# Patient Record
Sex: Male | Born: 1950
Health system: Southern US, Community
[De-identification: ages and names within clinical notes are randomized; demographics above are authoritative.]

## PROBLEM LIST (undated history)

## (undated) DIAGNOSIS — M1990 Unspecified osteoarthritis, unspecified site: Secondary | ICD-10-CM

## (undated) DIAGNOSIS — K219 Gastro-esophageal reflux disease without esophagitis: Secondary | ICD-10-CM

## (undated) DIAGNOSIS — N400 Enlarged prostate without lower urinary tract symptoms: Secondary | ICD-10-CM

## (undated) DIAGNOSIS — C61 Malignant neoplasm of prostate: Secondary | ICD-10-CM

## (undated) DIAGNOSIS — Z973 Presence of spectacles and contact lenses: Secondary | ICD-10-CM

## (undated) DIAGNOSIS — Z87442 Personal history of urinary calculi: Secondary | ICD-10-CM

## (undated) HISTORY — PX: PROSTATE BIOPSY: SHX241

## (undated) HISTORY — PX: WISDOM TOOTH EXTRACTION: SHX21

---

## 2014-01-30 ENCOUNTER — Encounter (HOSPITAL_BASED_OUTPATIENT_CLINIC_OR_DEPARTMENT_OTHER): Payer: Self-pay | Admitting: Emergency Medicine

## 2014-01-30 ENCOUNTER — Encounter (HOSPITAL_COMMUNITY): Payer: Self-pay | Admitting: *Deleted

## 2014-01-30 ENCOUNTER — Emergency Department (HOSPITAL_BASED_OUTPATIENT_CLINIC_OR_DEPARTMENT_OTHER)
Admission: EM | Admit: 2014-01-30 | Discharge: 2014-01-30 | Disposition: A | Discharge status: Home / Self Care | Payer: BC Managed Care – PPO | Attending: Emergency Medicine | Admitting: Emergency Medicine

## 2014-01-30 ENCOUNTER — Emergency Department (HOSPITAL_BASED_OUTPATIENT_CLINIC_OR_DEPARTMENT_OTHER): Payer: BC Managed Care – PPO

## 2014-01-30 DIAGNOSIS — S61209A Unspecified open wound of unspecified finger without damage to nail, initial encounter: Secondary | ICD-10-CM | POA: Insufficient documentation

## 2014-01-30 DIAGNOSIS — Z23 Encounter for immunization: Secondary | ICD-10-CM | POA: Diagnosis not present

## 2014-01-30 DIAGNOSIS — Z87442 Personal history of urinary calculi: Secondary | ICD-10-CM | POA: Insufficient documentation

## 2014-01-30 DIAGNOSIS — Y929 Unspecified place or not applicable: Secondary | ICD-10-CM | POA: Diagnosis not present

## 2014-01-30 DIAGNOSIS — Y9389 Activity, other specified: Secondary | ICD-10-CM | POA: Insufficient documentation

## 2014-01-30 DIAGNOSIS — M129 Arthropathy, unspecified: Secondary | ICD-10-CM | POA: Diagnosis not present

## 2014-01-30 DIAGNOSIS — W278XXA Contact with other nonpowered hand tool, initial encounter: Secondary | ICD-10-CM | POA: Insufficient documentation

## 2014-01-30 DIAGNOSIS — S66521A Laceration of intrinsic muscle, fascia and tendon of left index finger at wrist and hand level, initial encounter: Secondary | ICD-10-CM

## 2014-01-30 DIAGNOSIS — S61211A Laceration without foreign body of left index finger without damage to nail, initial encounter: Secondary | ICD-10-CM

## 2014-01-30 MED ORDER — CEFAZOLIN SODIUM 1-5 GM-% IV SOLN: 1.0000 g | Freq: Once | INTRAVENOUS | Status: DC

## 2014-01-30 MED ORDER — OXYCODONE-ACETAMINOPHEN 5-325 MG PO TABS: 1.0000 | ORAL_TABLET | Freq: Four times a day (QID) | ORAL | Status: DC | PRN

## 2014-01-30 MED ORDER — TETANUS-DIPHTH-ACELL PERTUSSIS 5-2.5-18.5 LF-MCG/0.5 IM SUSP
0.5000 mL | Freq: Once | INTRAMUSCULAR | Status: AC
  Administered 2014-01-30: 0.5 mL via INTRAMUSCULAR
  Filled 2014-01-30: qty 0.5

## 2014-01-30 NOTE — ED Provider Notes (Signed)
Pt seen with Oswaldo ConroyVictoria Creech, PA-C. Pt with injury of his left first MCP from a saw. Refill less than 3 seconds. Unable to fully extend index finger at MCP, PIP and DIP. X-ray pending. Tetanus will be updated. Plan to consult hand surgery.  I spoke with Dr. Melvyn Novasrtmann, hand surgery on call who states patient will need surgery tomorrow morning. [facility] at 7:30 AM for surgery at 9AM. NPO after midnight. No abx. Wrap with betadine applied along with finger splinted in extension per Dr. Melvyn Novasrtmann. Pt agreeable with plan. Stable for d/c. Return precautions given.  Case discussed with attending Dr. Wilkie AyeHorton who also evaluated patient and agrees with plan of care.   Trevor MaceRobyn M Albert, PA-C 01/30/14 2129

## 2014-01-30 NOTE — ED Notes (Signed)
C/o laceration to left hand and index finger from saw. States he has nl feeling in finger but cannot raise index finger of left hand. Bleeding controlled.

## 2014-01-30 NOTE — ED Provider Notes (Signed)
CSN: [id number]     Arrival date & time 01/30/14  1416 History   First MD Initiated Contact with Patient 01/30/14 1421     Chief Complaint  Patient presents with  . Extremity Laceration    HPI Patient is a 63 year old male who presents after a left hand injury with a saw. Injury to the left second finger MCP on the dorsal hand. Also small laceration to distal left second finger distal to PIP. Pain is mild but persistent. It is not worsening or improving and does not radiate. Patient can only minimally extend his left second finger but moves all other fingers without difficulty or pain. Patient denies injury anywhere else. He has not used any medications or pain relief. Patient cannot remember if last tetanus was in the last 5 or 10 years. Patient denies fevers.  Past Medical History  Diagnosis Date  . Kidney stone   . Arthritis    Past Surgical History  Procedure Laterality Date  . Wisdom tooth extraction     Family History  Problem Relation Age of Onset  . Diabetes Father   . Diabetes Sister    History  Substance Use Topics  . Smoking status: Never Smoker   . Smokeless tobacco: Never Used  . Alcohol Use: Not on file     Comment: rare    Review of Systems  Musculoskeletal: Positive for joint swelling.  Skin: Positive for wound.  Neurological: Positive for weakness and numbness.      Allergies  Review of patient's allergies indicates no known allergies.  Home Medications   Prior to Admission medications   Medication Sig Start Date End Date Taking? Authorizing Provider  oxyCODONE-acetaminophen (PERCOCET) 5-325 MG per tablet Take 1-2 tablets by mouth every 6 (six) hours as needed for severe pain. 01/30/14   Jorge Mace, PA-C   BP 140/85  Pulse 64  Temp(Src) 98.5 F (36.9 C) (Oral)  Resp 20  Wt 178 lb (80.74 kg)  SpO2 98% Physical Exam  Nursing note and vitals reviewed. Constitutional: He appears well-developed and well-nourished. No distress.  HENT:  Head:  Normocephalic and atraumatic.  Eyes: Conjunctivae are normal. Right eye exhibits no discharge. Left eye exhibits no discharge. No scleral icterus.  Pulmonary/Chest: Effort normal. No respiratory distress.  Musculoskeletal:  3-4 cm laceration to left dorsal MCP with the tendon exposed. Bleeding controlled without gross contamination. No foreign bodies. Strength 5/5 in thumb and fingers 3-5. Strength intact with flexion in second finger but significantly decreased with extension at MCP and PIP. Cap refill in all fingers less than 3 seconds. Radial pulses 2+ bilaterally. 2 cm laceration to left second finger distal to PIP. Bleeding controlled without gross contamination.  Neurological: He is alert. Coordination normal.  Skin: He is not diaphoretic.  Psychiatric: He has a normal mood and affect. His behavior is normal.    ED Course  Procedures (including critical care time) Labs Review Labs Reviewed - No data to display  Imaging Review Dg Hand Complete Left  01/30/2014   CLINICAL DATA:  Laceration LEFT hand and index finger from a saw, unable to raise index finger LEFT hand  EXAM: LEFT HAND - COMPLETE 3+ VIEW  COMPARISON:  None  FINDINGS: Mild osseous demineralization.  Joint space narrowing at second and third MCP joints with spur formation.  No acute fracture, dislocation or bone destruction.  Fingers superimposed on lateral view limiting assessment.  Dorsal soft tissue swelling overlying the distal metacarpals on lateral view.  IMPRESSION: No definite acute osseous abnormalities.  Osseous demineralization with degenerative changes at the second and third MCP joints.   Electronically Signed   By: Ulyses SouthwardMark  Boles M.D.   On: 01/30/2014 15:18     EKG Interpretation None     Meds given in ED:  Medications  Tdap (BOOSTRIX) injection 0.5 mL (0.5 mLs Intramuscular Given 01/30/14 1523)    There are no discharge medications for this patient.     MDM   Final diagnoses:  Laceration of second  finger of left hand with tendon involvement, initial encounter  Patient with deep laceration to left second finger with decreased extension and tendon involvement. xray without signs of fracture.  Orthopedic hand, Dr. Melvyn Novasrtmann was consulted and scheduled him [facility]. Hand cleaned and wrapped with betadine. Splint on second finger in extension. Laceration was not repaired. No abx. Patient was discharged with Percocet for pain relief and told to be NPO after midnight. Tetanus updated today.  Discussed return precautions with patient. Discussed all results and patient verbalizes understanding and agrees with plan.  This is a shared patient. This patient was discussed with the physician, Dr. Wilkie AyeHorton who saw and evaluated the patient.     Jorge SjogrenVictoria L Khamari Sheehan, PA-C 01/30/14 [phone]

## 2014-01-30 NOTE — Discharge Instructions (Signed)
Followup at [facility] short stay at [facility] tomorrow morning at 7:30 AM. You will be having surgery by Dr. Melvyn Novas. You should not have anything to eat or drink after midnight tonight. Keep the splints that is applied today on your finger until surgery tomorrow. Laceration Care, Adult A laceration is a cut or lesion that goes through all layers of the skin and into the tissue just beneath the skin. TREATMENT  Some lacerations may not require closure. Some lacerations may not be able to be closed due to an increased risk of infection. It is important to see your caregiver as soon as possible after an injury to minimize the risk of infection and maximize the opportunity for successful closure. If closure is appropriate, pain medicines may be given, if needed. The wound will be cleaned to help prevent infection. Your caregiver will use stitches (sutures), staples, wound glue (adhesive), or skin adhesive strips to repair the laceration. These tools bring the skin edges together to allow for faster healing and a better cosmetic outcome. However, all wounds will heal with a scar. Once the wound has healed, scarring can be minimized by covering the wound with sunscreen during the day for 1 full year. HOME CARE INSTRUCTIONS  For sutures or staples:  Keep the wound clean and dry.  If you were given a bandage (dressing), you should change it at least once a day. Also, change the dressing if it becomes wet or dirty, or as directed by your caregiver.  Wash the wound with soap and water 2 times a day. Rinse the wound off with water to remove all soap. Pat the wound dry with a clean towel.  After cleaning, apply a thin layer of the antibiotic ointment as recommended by your caregiver. This will help prevent infection and keep the dressing from sticking.  You may shower as usual after the first 24 hours. Do not soak the wound in water until the sutures are removed.  Only take over-the-counter or  prescription medicines for pain, discomfort, or fever as directed by your caregiver.  Get your sutures or staples removed as directed by your caregiver. For skin adhesive strips:  Keep the wound clean and dry.  Do not get the skin adhesive strips wet. You may bathe carefully, using caution to keep the wound dry.  If the wound gets wet, pat it dry with a clean towel.  Skin adhesive strips will fall off on their own. You may trim the strips as the wound heals. Do not remove skin adhesive strips that are still stuck to the wound. They will fall off in time. For wound adhesive:  You may briefly wet your wound in the shower or bath. Do not soak or scrub the wound. Do not swim. Avoid periods of heavy perspiration until the skin adhesive has fallen off on its own. After showering or bathing, gently pat the wound dry with a clean towel.  Do not apply liquid medicine, cream medicine, or ointment medicine to your wound while the skin adhesive is in place. This may loosen the film before your wound is healed.  If a dressing is placed over the wound, be careful not to apply tape directly over the skin adhesive. This may cause the adhesive to be pulled off before the wound is healed.  Avoid prolonged exposure to sunlight or tanning lamps while the skin adhesive is in place. Exposure to ultraviolet light in the first year will darken the scar.  The skin adhesive  will usually remain in place for 5 to 10 days, then naturally fall off the skin. Do not pick at the adhesive film. You may need a tetanus shot if:  You cannot remember when you had your last tetanus shot.  You have never had a tetanus shot. If you get a tetanus shot, your arm may swell, get red, and feel warm to the touch. This is common and not a problem. If you need a tetanus shot and you choose not to have one, there is a rare chance of getting tetanus. Sickness from tetanus can be serious. SEEK MEDICAL CARE IF:   You have redness,  swelling, or increasing pain in the wound.  You see a red line that goes away from the wound.  You have yellowish-white fluid (pus) coming from the wound.  You have a fever.  You notice a bad smell coming from the wound or dressing.  Your wound breaks open before or after sutures have been removed.  You notice something coming out of the wound such as wood or glass.  Your wound is on your hand or foot and you cannot move a finger or toe. SEEK IMMEDIATE MEDICAL CARE IF:   Your pain is not controlled with prescribed medicine.  You have severe swelling around the wound causing pain and numbness or a change in color in your arm, hand, leg, or foot.  Your wound splits open and starts bleeding.  You have worsening numbness, weakness, or loss of function of any joint around or beyond the wound.  You develop painful lumps near the wound or on the skin anywhere on your body. MAKE SURE YOU:   Understand these instructions.  Will watch your condition.  Will get help right away if you are not doing well or get worse. Document Released: 06/05/2005 Document Revised: 08/28/2011 Document Reviewed: 11/29/2010 [facility] Patient Information 2015 [place] [place]. This information is not intended to replace advice given to you by your health care provider. Make sure you discuss any questions you have with your health care provider. Tendon Injury Tendons are strong, cordlike structures that connect muscle to bone. Tendons are made up of woven fibers, like a rope. A tendon injury is a tear (rupture) of the tendon. The rupture may be partial (only a few of the fibers in your tendon rupture) or complete (your entire tendon ruptures). CAUSES  Tendon injuries can be caused by high-stress activities, such as sports. They also can be caused by a repetitive injury or by a single injury from an excessive, rapid force. SYMPTOMS  Symptoms of tendon injury include pain when you move the joint close to the  tendon. Other symptoms are swelling, redness, and warmth. DIAGNOSIS  Tendon injuries often can be diagnosed by physical exam. However, sometimes an X-ray exam or advanced imaging, such as magnetic resonance imaging (MRI), is necessary to determine the extent of the injury. TREATMENT  Partial tendon ruptures often can be treated with immobilization. A splint, bandage, or removable brace usually is used to immobilize the injured tendon. Most injured tendons need to be immobilized for 1-2 months before they are completely healed. Complete tendon ruptures may require surgical reattachment. Document Released: 07/13/2004 Document Revised: 05/25/2011 Document Reviewed: 08/27/2011 [facility] Patient Information 2015 [place], [place]. This information is not intended to replace advice given to you by your health care provider. Make sure you discuss any questions you have with your health care provider.

## 2014-01-31 ENCOUNTER — Ambulatory Visit (HOSPITAL_COMMUNITY)
Admission: EM | Admit: 2014-01-31 | Discharge: 2014-01-31 | Disposition: A | Discharge status: Home / Self Care | Payer: BC Managed Care – PPO | Source: Ambulatory Visit | Attending: Orthopedic Surgery | Admitting: Orthopedic Surgery

## 2014-01-31 ENCOUNTER — Ambulatory Visit (HOSPITAL_COMMUNITY): Payer: BC Managed Care – PPO | Admitting: Anesthesiology

## 2014-01-31 ENCOUNTER — Encounter (HOSPITAL_COMMUNITY): Payer: Self-pay | Admitting: *Deleted

## 2014-01-31 ENCOUNTER — Encounter (HOSPITAL_COMMUNITY)
Admission: EM | Disposition: A | Discharge status: Home / Self Care | Payer: Self-pay | Source: Ambulatory Visit | Attending: Orthopedic Surgery

## 2014-01-31 ENCOUNTER — Encounter (HOSPITAL_COMMUNITY): Payer: BC Managed Care – PPO | Admitting: Anesthesiology

## 2014-01-31 DIAGNOSIS — S61409A Unspecified open wound of unspecified hand, initial encounter: Secondary | ICD-10-CM | POA: Diagnosis present

## 2014-01-31 DIAGNOSIS — S66909A Unspecified injury of unspecified muscle, fascia and tendon at wrist and hand level, unspecified hand, initial encounter: Principal | ICD-10-CM

## 2014-01-31 DIAGNOSIS — X58XXXA Exposure to other specified factors, initial encounter: Secondary | ICD-10-CM | POA: Insufficient documentation

## 2014-01-31 HISTORY — DX: Unspecified osteoarthritis, unspecified site: M19.90

## 2014-01-31 HISTORY — DX: Personal history of urinary calculi: Z87.442

## 2014-01-31 HISTORY — PX: TENDON REPAIR: SHX5111

## 2014-01-31 SURGERY — TENDON REPAIR
Anesthesia: General | Site: Finger | Laterality: Left

## 2014-01-31 MED ORDER — MIDAZOLAM HCL 5 MG/5ML IJ SOLN
INTRAMUSCULAR | Status: DC | PRN
  Administered 2014-01-31 (×2): 1 mg via INTRAVENOUS

## 2014-01-31 MED ORDER — LIDOCAINE HCL (CARDIAC) 20 MG/ML IV SOLN
INTRAVENOUS | Status: AC
  Filled 2014-01-31: qty 5

## 2014-01-31 MED ORDER — LIDOCAINE HCL (CARDIAC) 20 MG/ML IV SOLN
INTRAVENOUS | Status: DC | PRN
  Administered 2014-01-31: 40 mg via INTRAVENOUS

## 2014-01-31 MED ORDER — DEXAMETHASONE SODIUM PHOSPHATE 4 MG/ML IJ SOLN
INTRAMUSCULAR | Status: DC | PRN
  Administered 2014-01-31: 8 mg via INTRAVENOUS

## 2014-01-31 MED ORDER — DOCUSATE SODIUM 100 MG PO CAPS: 100.0000 mg | ORAL_CAPSULE | Freq: Two times a day (BID) | ORAL | Status: DC

## 2014-01-31 MED ORDER — HYDROMORPHONE HCL PF 1 MG/ML IJ SOLN
INTRAMUSCULAR | Status: AC
  Filled 2014-01-31: qty 1

## 2014-01-31 MED ORDER — OXYCODONE-ACETAMINOPHEN 5-325 MG PO TABS
1.0000 | ORAL_TABLET | Freq: Four times a day (QID) | ORAL | Status: DC | PRN
Start: 2014-01-31 — End: 2020-06-05

## 2014-01-31 MED ORDER — LACTATED RINGERS IV SOLN
INTRAVENOUS | Status: DC
  Administered 2014-01-31: 09:00:00 via INTRAVENOUS

## 2014-01-31 MED ORDER — LACTATED RINGERS IV SOLN
INTRAVENOUS | Status: DC | PRN
  Administered 2014-01-31: 11:00:00 via INTRAVENOUS

## 2014-01-31 MED ORDER — HYDROMORPHONE HCL PF 1 MG/ML IJ SOLN
0.2500 mg | INTRAMUSCULAR | Status: DC | PRN
  Administered 2014-01-31 (×2): 0.5 mg via INTRAVENOUS

## 2014-01-31 MED ORDER — PROPOFOL 10 MG/ML IV BOLUS
INTRAVENOUS | Status: DC | PRN
Start: 2014-01-31 — End: 2014-01-31
  Administered 2014-01-31: 180 mg via INTRAVENOUS

## 2014-01-31 MED ORDER — ONDANSETRON HCL 4 MG/2ML IJ SOLN: 4.0000 mg | Freq: Once | INTRAMUSCULAR | Status: DC | PRN

## 2014-01-31 MED ORDER — MIDAZOLAM HCL 2 MG/2ML IJ SOLN
INTRAMUSCULAR | Status: AC
  Filled 2014-01-31: qty 2

## 2014-01-31 MED ORDER — DEXAMETHASONE SODIUM PHOSPHATE 4 MG/ML IJ SOLN
INTRAMUSCULAR | Status: AC
  Filled 2014-01-31: qty 2

## 2014-01-31 MED ORDER — CHLORHEXIDINE GLUCONATE 4 % EX LIQD
60.0000 mL | Freq: Once | CUTANEOUS | Status: DC
  Filled 2014-01-31: qty 60

## 2014-01-31 MED ORDER — FENTANYL CITRATE 0.05 MG/ML IJ SOLN
INTRAMUSCULAR | Status: AC
  Filled 2014-01-31: qty 2

## 2014-01-31 MED ORDER — FENTANYL CITRATE 0.05 MG/ML IJ SOLN
INTRAMUSCULAR | Status: AC
  Filled 2014-01-31: qty 5

## 2014-01-31 MED ORDER — CEFAZOLIN SODIUM-DEXTROSE 2-3 GM-% IV SOLR
2.0000 g | INTRAVENOUS | Status: AC
  Administered 2014-01-31: 2 g via INTRAVENOUS
  Filled 2014-01-31: qty 50

## 2014-01-31 MED ORDER — BUPIVACAINE HCL (PF) 0.25 % IJ SOLN
INTRAMUSCULAR | Status: DC | PRN
  Administered 2014-01-31: 12 mL

## 2014-01-31 MED ORDER — CEPHALEXIN 500 MG PO CAPS: 500.0000 mg | ORAL_CAPSULE | Freq: Four times a day (QID) | ORAL | Status: DC

## 2014-01-31 MED ORDER — ONDANSETRON HCL 4 MG/2ML IJ SOLN
INTRAMUSCULAR | Status: DC | PRN
  Administered 2014-01-31: 4 mg via INTRAVENOUS

## 2014-01-31 MED ORDER — PROPOFOL 10 MG/ML IV BOLUS
INTRAVENOUS | Status: AC
  Filled 2014-01-31: qty 20

## 2014-01-31 MED ORDER — FENTANYL CITRATE 0.05 MG/ML IJ SOLN
INTRAMUSCULAR | Status: DC | PRN
  Administered 2014-01-31: 50 ug via INTRAVENOUS

## 2014-01-31 MED ORDER — ONDANSETRON HCL 4 MG/2ML IJ SOLN
INTRAMUSCULAR | Status: AC
  Filled 2014-01-31: qty 2

## 2014-01-31 MED ORDER — ROCURONIUM BROMIDE 50 MG/5ML IV SOLN
INTRAVENOUS | Status: AC
Start: 2014-01-31 — End: 2014-01-31
  Filled 2014-01-31: qty 1

## 2014-01-31 MED ORDER — PHENYLEPHRINE 40 MCG/ML (10ML) SYRINGE FOR IV PUSH (FOR BLOOD PRESSURE SUPPORT)
PREFILLED_SYRINGE | INTRAVENOUS | Status: AC
  Filled 2014-01-31: qty 10

## 2014-01-31 MED ORDER — BUPIVACAINE HCL (PF) 0.25 % IJ SOLN
INTRAMUSCULAR | Status: AC
  Filled 2014-01-31: qty 30

## 2014-01-31 SURGICAL SUPPLY — 54 items
BANDAGE ELASTIC 3 VELCRO ST LF (GAUZE/BANDAGES/DRESSINGS) ×3 IMPLANT
BANDAGE ELASTIC 4 VELCRO ST LF (GAUZE/BANDAGES/DRESSINGS) ×3 IMPLANT
BNDG COHESIVE 1X5 TAN STRL LF (GAUZE/BANDAGES/DRESSINGS) IMPLANT
BNDG ESMARK 4X9 LF (GAUZE/BANDAGES/DRESSINGS) ×3 IMPLANT
BNDG GAUZE ELAST 4 BULKY (GAUZE/BANDAGES/DRESSINGS) IMPLANT
CORDS BIPOLAR (ELECTRODE) ×3 IMPLANT
COVER SURGICAL LIGHT HANDLE (MISCELLANEOUS) ×3 IMPLANT
CUFF TOURNIQUET SINGLE 18IN (TOURNIQUET CUFF) ×3 IMPLANT
CUFF TOURNIQUET SINGLE 24IN (TOURNIQUET CUFF) IMPLANT
DRAPE SURG 17X23 STRL (DRAPES) ×3 IMPLANT
DRSG ADAPTIC 3X8 NADH LF (GAUZE/BANDAGES/DRESSINGS) ×3 IMPLANT
GAUZE SPONGE 2X2 8PLY STRL LF (GAUZE/BANDAGES/DRESSINGS) IMPLANT
GAUZE SPONGE 4X4 12PLY STRL (GAUZE/BANDAGES/DRESSINGS) IMPLANT
GAUZE SPONGE 4X4 16PLY XRAY LF (GAUZE/BANDAGES/DRESSINGS) ×3 IMPLANT
GLOVE BIOGEL PI IND STRL 8.5 (GLOVE) ×1 IMPLANT
GLOVE BIOGEL PI INDICATOR 8.5 (GLOVE) ×2
GLOVE SS BIOGEL STRL SZ 7 (GLOVE) ×1 IMPLANT
GLOVE SUPERSENSE BIOGEL SZ 7 (GLOVE) ×2
GLOVE SURG ORTHO 8.0 STRL STRW (GLOVE) ×3 IMPLANT
GOWN STRL REUS W/ TWL LRG LVL3 (GOWN DISPOSABLE) ×2 IMPLANT
GOWN STRL REUS W/ TWL XL LVL3 (GOWN DISPOSABLE) ×1 IMPLANT
GOWN STRL REUS W/TWL LRG LVL3 (GOWN DISPOSABLE) ×4
GOWN STRL REUS W/TWL XL LVL3 (GOWN DISPOSABLE) ×2
KIT BASIN OR (CUSTOM PROCEDURE TRAY) ×3 IMPLANT
KIT ROOM TURNOVER OR (KITS) ×3 IMPLANT
MANIFOLD NEPTUNE II (INSTRUMENTS) ×3 IMPLANT
NEEDLE HYPO 25GX1X1/2 BEV (NEEDLE) IMPLANT
NS IRRIG 1000ML POUR BTL (IV SOLUTION) ×3 IMPLANT
PACK ORTHO EXTREMITY (CUSTOM PROCEDURE TRAY) ×3 IMPLANT
PAD ARMBOARD 7.5X6 YLW CONV (MISCELLANEOUS) ×6 IMPLANT
PAD CAST 4YDX4 CTTN HI CHSV (CAST SUPPLIES) ×1 IMPLANT
PADDING CAST COTTON 4X4 STRL (CAST SUPPLIES) ×2
SOAP 2 % CHG 4 OZ (WOUND CARE) ×3 IMPLANT
SPECIMEN JAR SMALL (MISCELLANEOUS) ×3 IMPLANT
SPONGE GAUZE 2X2 STER 10/PKG (GAUZE/BANDAGES/DRESSINGS)
SPONGE GAUZE 4X4 12PLY STER LF (GAUZE/BANDAGES/DRESSINGS) ×3 IMPLANT
SUCTION FRAZIER TIP 10 FR DISP (SUCTIONS) IMPLANT
SUT FIBERWIRE 2-0 18 17.9 3/8 (SUTURE) ×3
SUT FIBERWIRE 3-0 18 TAPR NDL (SUTURE) ×3
SUT FIBERWIRE 4-0 18 TAPR NDL (SUTURE) ×6
SUT MERSILENE 4 0 P 3 (SUTURE) IMPLANT
SUT PROLENE 4 0 PS 2 18 (SUTURE) ×6 IMPLANT
SUT VIC AB 2-0 CT1 27 (SUTURE)
SUT VIC AB 2-0 CT1 TAPERPNT 27 (SUTURE) IMPLANT
SUTURE FIBERWR 2-0 18 17.9 3/8 (SUTURE) ×1 IMPLANT
SUTURE FIBERWR 3-0 18 TAPR NDL (SUTURE) ×1 IMPLANT
SUTURE FIBERWR 4-0 18 TAPR NDL (SUTURE) ×2 IMPLANT
SYR CONTROL 10ML LL (SYRINGE) IMPLANT
TOWEL OR 17X24 6PK STRL BLUE (TOWEL DISPOSABLE) ×3 IMPLANT
TOWEL OR 17X26 10 PK STRL BLUE (TOWEL DISPOSABLE) ×3 IMPLANT
TUBE CONNECTING 12'X1/4 (SUCTIONS)
TUBE CONNECTING 12X1/4 (SUCTIONS) IMPLANT
UNDERPAD 30X30 INCONTINENT (UNDERPADS AND DIAPERS) ×3 IMPLANT
WATER STERILE IRR 1000ML POUR (IV SOLUTION) ×3 IMPLANT

## 2014-01-31 NOTE — Op Note (Signed)
NAMOsvaldo Shipper:  Rodriguez, Jorge Rodriguez                 ACCOUNT NO.:  [id number]  MEDICAL RECORD NO.:  [id number]  LOCATION:  [facility]                         FACILITY:  [facility]  PHYSICIAN:  Madelynn DoneFred W Magnus Crescenzo IV, MD  DATE OF BIRTH:  Dec 13, 1950  DATE OF PROCEDURE:  01/31/2014 DATE OF DISCHARGE:                              OPERATIVE REPORT   PREOPERATIVE DIAGNOSIS:  Left hand laceration with tendon involvement.  POSTOPERATIVE DIAGNOSIS:  Left hand laceration with tendon involvement.  ATTENDING PHYSICIAN:  Madelynn DoneFred W Mallory Enriques IV, MD, who scrubbed and present for the entire procedure.  ASSISTANT SURGEON:  None.  ANESTHESIA:  General via LMA.  SURGICAL PROCEDURES: LEFT HAND EXTENSOR TENDON, EIP TENDON REPAIR LEFT HAND EXTENSOR TENDON REPAIR EDC TO INDEX LEFT HAND TRAUMATIC LACERATION REPAIR 3 CM  SURGICAL INDICATIONS:  Jorge Rodriguez is a right-hand-dominant gentleman who sustained a sharp laceration in the dorsal aspect of his left hand using a saw.  The patient was seen in the emergency department.  Based on the tendon deficit, he was recommended to undergo the above procedure. Risks, benefits, and alternatives were discussed in detail with the patient and signed informed consent was obtained.  Risks include but not limited to bleeding, infection, damage to nearby nerves, arteries, or tendons, loss of motion of wrist and digits, incomplete relief of symptoms, and need for further surgical intervention.  DESCRIPTION OF PROCEDURE:  The patient was properly identified in the preoperative holding area.  A mark with a permanent marker was made on the left hand to indicate the correct operative site.  The patient was then brought back to the operating room, placed supine on the anesthesia room table.  General anesthesia was administered.  The patient tolerated this well.  A well-padded tourniquet was placed on left brachium and sealed with 1000 drape.  Left upper extremity was then prepped and draped in normal sterile  fashion.  Time-out was called.  Correct site was identified, and procedure then begun.  Attention was then turned to the left hand where the 3 cm traumatic laceration was then extended both proximally and distally at the level of zone 5 over the sagittal band region of the index finger in the joint capsule region.  Following this, the EIP and EDC to the index finger were then carefully identified. There was complete rupture of the EDC and EIP.  Tendon repair was then carried out in the dorsal aspect of the hand with 4-0 and 3-0 FiberWire suture.  Horizontal mattress and figure-of-eight sutures were then used to repair the extensor mechanism nicely.  The wound was then thoroughly irrigated.  After repair of both tendons, the traumatic laceration was then repaired with Prolene sutures.  Adaptic dressing, sterile compressive bandage then applied.  The patient was then placed in a well- padded long-arm volar splint keeping the fingers in full extension.  The patient tolerated the procedure well, returned to recovery room in good condition.  POSTPROCEDURE PLAN:  The patient was discharged to home, will be seen back in the office in approximately 9 days for wound check, suture removal, and begin a postoperative zone 5 extensor tendon repair protocol.     Sharma CovertFred W Yaakov Saindon IV,  MD     FWO/MEDQ  D:  01/31/2014  T:  01/31/2014  Job:  [id number]

## 2014-01-31 NOTE — ED Provider Notes (Signed)
Medical screening examination/treatment/procedure(s) were conducted as a shared visit with non-physician practitioner(s) and myself.  I personally evaluated the patient during the encounter.   EKG Interpretation None      See additional documentation.  Shon Batonourtney F Twanisha Foulk, MD 01/31/14 [phone]

## 2014-01-31 NOTE — Anesthesia Postprocedure Evaluation (Signed)
  Anesthesia Post-op Note  Patient: Jorge Rodriguez  Procedure(s) Performed: Procedure(s) with comments: TENDON REPAIR (Left) - wants to follow  Patient Location: PACU  Anesthesia Type:General  Level of Consciousness: awake and alert   Airway and Oxygen Therapy: Patient Spontanous Breathing and Patient connected to nasal cannula oxygen  Post-op Pain: mild  Post-op Assessment: Post-op Vital signs reviewed, Patient's Cardiovascular Status Stable, Respiratory Function Stable, Patent Airway and Pain level controlled  Post-op Vital Signs: stable  Last Vitals:  Filed Vitals:   01/31/14 1215  BP: 132/74  Pulse: 53  Temp:   Resp: 14    Complications: No apparent anesthesia complications

## 2014-01-31 NOTE — H&P (Signed)
Jorge Rodriguez is an 63 y.o. male.   Chief Complaint: LEFT HAND LACERATION HPI: PT SUSTAINED INJURY FROM SAW TO DORSUM OF LEFT HAND YESTERDAY WAS SEEN AT [facility] AND HERE TODAY FOR SURGERY ON LEFT INDEX FINGER/HAND NO PRIOR SURGERY TO LEFT HAND  Past Medical History  Diagnosis Date  . Arthritis   . History of kidney stones     Past Surgical History  Procedure Laterality Date  . Wisdom tooth extraction      Family History  Problem Relation Age of Onset  . Diabetes Father   . Diabetes Sister    Social History:  reports that he has never smoked. He has never used smokeless tobacco. He reports that he does not drink alcohol or use illicit drugs.  Allergies: No Known Allergies  Medications Prior to Admission  Medication Sig Dispense Refill  . oxyCODONE-acetaminophen (PERCOCET) 5-325 MG per tablet Take 1-2 tablets by mouth every 6 (six) hours as needed for severe pain.  10 tablet  0    No results found for this or any previous visit (from the past 48 hour(s)). Dg Hand Complete Left  01/30/2014   CLINICAL DATA:  Laceration LEFT hand and index finger from a saw, unable to raise index finger LEFT hand  EXAM: LEFT HAND - COMPLETE 3+ VIEW  COMPARISON:  None  FINDINGS: Mild osseous demineralization.  Joint space narrowing at second and third MCP joints with spur formation.  No acute fracture, dislocation or bone destruction.  Fingers superimposed on lateral view limiting assessment.  Dorsal soft tissue swelling overlying the distal metacarpals on lateral view.  IMPRESSION: No definite acute osseous abnormalities.  Osseous demineralization with degenerative changes at the second and third MCP joints.   Electronically Signed   By: Ulyses Southward M.D.   On: 01/30/2014 15:18    ROS NO RECENT ILLNESSES OR HOSPITALIZATIONS  Blood pressure 156/79, pulse 65, temperature 98.2 F (36.8 C), temperature source Oral, resp. rate 12, height 5\' 10"  (1.778 m), weight 80.74 kg (178 lb),  SpO2 100.00%. Physical Exam  General Appearance:  Alert, cooperative, no distress, appears stated age  Head:  Normocephalic, without obvious abnormality, atraumatic  Eyes:  Pupils equal, conjunctiva/corneas clear,         Throat: Lips, mucosa, and tongue normal; teeth and gums normal  Neck: No visible masses     Lungs:   respirations unlabored  Chest Wall:  No tenderness or deformity  Heart:  Regular rate and rhythm,  Abdomen:   Soft, non-tender,         Extremities: LEFT HAND: IN VOLAR SPLINT FINGERS WARM WELL PERFUSED ABLE TO WIGGLE FINGERS  Pulses: 2+ and symmetric  Skin: Skin color, texture, turgor normal, no rashes or lesions     Neurologic: Normal    Assessment/Plan LEFT HAND LACERATION WITH TENDON INVOLVEMENT  LEFT HAND WOUND EXPLORATION AND REPAIR OF INDICATED STRUCTURES  R/B/A DISCUSSED WITH PT IN OFFICE.  PT VOICED UNDERSTANDING OF PLAN CONSENT SIGNED DAY OF SURGERY PT SEEN AND EXAMINED PRIOR TO OPERATIVE PROCEDURE/DAY OF SURGERY SITE MARKED. QUESTIONS ANSWERED WILL GO HOME FOLLOWING SURGERY  WE ARE PLANNING SURGERY FOR YOUR UPPER EXTREMITY. THE RISKS AND BENEFITS OF SURGERY INCLUDE BUT NOT LIMITED TO BLEEDING INFECTION, DAMAGE TO NEARBY NERVES ARTERIES TENDONS, FAILURE OF SURGERY TO ACCOMPLISH ITS INTENDED GOALS, PERSISTENT SYMPTOMS AND NEED FOR FURTHER SURGICAL INTERVENTION. WITH THIS IN MIND WE WILL PROCEED. I HAVE DISCUSSED WITH THE PATIENT THE PRE AND POSTOPERATIVE REGIMEN AND THE  DOS AND DON'TS. PT VOICED UNDERSTANDING AND INFORMED CONSENT SIGNED.  Sharma CovertORTMANN,Dolores Ewing W 01/31/2014, 10:17 AM

## 2014-01-31 NOTE — Transfer of Care (Signed)
Immediate Anesthesia Transfer of Care Note  Patient: Jorge RosebushOwen Rodriguez  Procedure(s) Performed: Procedure(s) with comments: TENDON REPAIR (Left) - wants to follow  Patient Location: PACU  Anesthesia Type:General  Level of Consciousness: awake, alert  and oriented  Airway & Oxygen Therapy: Patient Spontanous Breathing and Patient connected to nasal cannula oxygen  Post-op Assessment: Report given to PACU RN and Post -op Vital signs reviewed and stable  Post vital signs: Reviewed and stable  Complications: No apparent anesthesia complications

## 2014-01-31 NOTE — Brief Op Note (Signed)
01/31/2014  10:20 AM  PATIENT:  Jorge Rodriguez  63 y.o. male  PRE-OPERATIVE DIAGNOSIS:  Left Index Finger Laceration  POST-OPERATIVE DIAGNOSIS:  same  PROCEDURE:  Procedure(s) with comments: TENDON REPAIR (Left) - wants to follow  SURGEON:  Surgeon(s) and Role:    * Sharma CovertFred W Jaxn Chiquito, MD - Primary  PHYSICIAN ASSISTANT:   ASSISTANTS: none   ANESTHESIA:   general  EBL:     BLOOD ADMINISTERED:none  DRAINS: none   LOCAL MEDICATIONS USED:  MARCAINE     SPECIMEN:  No Specimen  DISPOSITION OF SPECIMEN:  N/A  COUNTS:  YES  TOURNIQUET:    DICTATION: .[id number]  PLAN OF CARE: Discharge to home after PACU  PATIENT DISPOSITION:  PACU - hemodynamically stable.   Delay start of Pharmacological VTE agent (>24hrs) due to surgical blood loss or risk of bleeding: not applicable

## 2014-01-31 NOTE — Anesthesia Procedure Notes (Signed)
Procedure Name: LMA Insertion Date/Time: 01/31/2014 10:50 AM Performed by: Leonel Ramsay'LAUGHLIN, Adahlia Stembridge H Pre-anesthesia Checklist: Timeout performed, Patient identified, Emergency Drugs available, Suction available and Patient being monitored Patient Re-evaluated:Patient Re-evaluated prior to inductionOxygen Delivery Method: Circle system utilized Preoxygenation: Pre-oxygenation with 100% oxygen Intubation Type: IV induction LMA: LMA inserted LMA Size: 5.0 Number of attempts: 1 Placement Confirmation: positive ETCO2 and breath sounds checked- equal and bilateral Tube secured with: Tape Dental Injury: Teeth and Oropharynx as per pre-operative assessment

## 2014-01-31 NOTE — Anesthesia Preprocedure Evaluation (Signed)
Anesthesia Evaluation  Patient identified by MRN, date of birth, ID band Patient awake    Reviewed: Allergy & Precautions, H&P , NPO status , Patient's Chart, lab work & pertinent test results  Airway Mallampati: II  Neck ROM: Full    Dental  (+) Teeth Intact, Dental Advisory Given   Pulmonary  breath sounds clear to auscultation        Cardiovascular Rhythm:Regular Rate:Normal     Neuro/Psych    GI/Hepatic   Endo/Other    Renal/GU      Musculoskeletal   Abdominal   Peds  Hematology   Anesthesia Other Findings   Reproductive/Obstetrics                           Anesthesia Physical Anesthesia Plan  ASA: I  Anesthesia Plan: General   Post-op Pain Management:    Induction: Intravenous  Airway Management Planned: LMA  Additional Equipment:   Intra-op Plan:   Post-operative Plan:   Informed Consent: I have reviewed the patients History and Physical, chart, labs and discussed the procedure including the risks, benefits and alternatives for the proposed anesthesia with the patient or authorized representative who has indicated his/her understanding and acceptance.   Dental advisory given  Plan Discussed with: CRNA and Anesthesiologist  Anesthesia Plan Comments:         Anesthesia Quick Evaluation

## 2014-01-31 NOTE — ED Provider Notes (Signed)
Medical screening examination/treatment/procedure(s) were conducted as a shared visit with non-physician practitioner(s) and myself.  I personally evaluated the patient during the encounter.   EKG Interpretation None      Patient presents with a left hand laceration. States that he was working and cut his left hand with a fall. He is right-handed. He has a 3-4 similar laceration over the MCP joint of the second digit of the left hand on the dorsal side. Tendon appears to be severed. Patient is unable to extend at the PIP or MCP joint. Tetanus is updated. X-ray was obtained. Discussed with Dr. Orlan Leavensrtman who will arrange for further management.  Shon Batonourtney F Eliane Hammersmith, MD 01/31/14 1020

## 2014-01-31 NOTE — Discharge Instructions (Signed)
KEEP BANDAGE CLEAN AND DRY CALL OFFICE FOR F/U APPT [phone] IN 9 DAYS ALSO CALL FOR THERAPY APPT [phone] EXT 1601 DR Melvyn NovasTMANN CELL [phone] KEEP HAND ELEVATED ABOVE HEART OK TO APPLY ICE TO OPERATIVE AREA CONTACT OFFICE IF ANY WORSENING PAIN OR CONCERNS.  What to eat:  For your first meals, you should eat lightly; only small meals initially.  If you do not have nausea, you may eat larger meals.  Avoid spicy, greasy and heavy food.    General Anesthesia, Adult, Care After  Refer to this sheet in the next few weeks. These instructions provide you with information on caring for yourself after your procedure. Your health care provider may also give you more specific instructions. Your treatment has been planned according to current medical practices, but problems sometimes occur. Call your health care provider if you have any problems or questions after your procedure.  WHAT TO EXPECT AFTER THE PROCEDURE  After the procedure, it is typical to experience:  Sleepiness.  Nausea and vomiting. HOME CARE INSTRUCTIONS  For the first 24 hours after general anesthesia:  Have a responsible person with you.  Do not drive a car. If you are alone, do not take public transportation.  Do not drink alcohol.  Do not take medicine that has not been prescribed by your health care provider.  Do not sign important papers or make important decisions.  You may resume a normal diet and activities as directed by your health care provider.  Change bandages (dressings) as directed.  If you have questions or problems that seem related to general anesthesia, call the hospital and ask for the anesthetist or anesthesiologist on call. SEEK MEDICAL CARE IF:  You have nausea and vomiting that continue the day after anesthesia.  You develop a rash. SEEK IMMEDIATE MEDICAL CARE IF:  You have difficulty breathing.  You have chest pain.  You have any allergic problems. Document Released: 09/11/2000 Document Revised:  02/05/2013 Document Reviewed: 12/19/2012  [facility] Patient Information 2014 [employer] [place].

## 2014-02-02 ENCOUNTER — Encounter (HOSPITAL_COMMUNITY): Payer: Self-pay | Admitting: Orthopedic Surgery

## 2016-04-26 DIAGNOSIS — N529 Male erectile dysfunction, unspecified: Secondary | ICD-10-CM | POA: Diagnosis not present

## 2016-04-26 DIAGNOSIS — M545 Low back pain: Secondary | ICD-10-CM | POA: Diagnosis not present

## 2016-04-26 DIAGNOSIS — G8929 Other chronic pain: Secondary | ICD-10-CM | POA: Diagnosis not present

## 2016-05-10 DIAGNOSIS — M545 Low back pain: Secondary | ICD-10-CM | POA: Diagnosis not present

## 2016-05-16 DIAGNOSIS — M545 Low back pain: Secondary | ICD-10-CM | POA: Diagnosis not present

## 2016-05-18 DIAGNOSIS — M545 Low back pain: Secondary | ICD-10-CM | POA: Diagnosis not present

## 2016-05-26 DIAGNOSIS — M545 Low back pain: Secondary | ICD-10-CM | POA: Diagnosis not present

## 2016-05-30 DIAGNOSIS — M545 Low back pain: Secondary | ICD-10-CM | POA: Diagnosis not present

## 2016-05-30 DIAGNOSIS — G8929 Other chronic pain: Secondary | ICD-10-CM | POA: Diagnosis not present

## 2016-06-02 DIAGNOSIS — M545 Low back pain: Secondary | ICD-10-CM | POA: Diagnosis not present

## 2016-06-09 DIAGNOSIS — M545 Low back pain: Secondary | ICD-10-CM | POA: Diagnosis not present

## 2016-06-20 DIAGNOSIS — M545 Low back pain: Secondary | ICD-10-CM | POA: Diagnosis not present

## 2016-06-28 DIAGNOSIS — M545 Low back pain: Secondary | ICD-10-CM | POA: Diagnosis not present

## 2016-07-04 DIAGNOSIS — M545 Low back pain: Secondary | ICD-10-CM | POA: Diagnosis not present

## 2016-07-18 DIAGNOSIS — M545 Low back pain: Secondary | ICD-10-CM | POA: Diagnosis not present

## 2016-07-24 DIAGNOSIS — H04123 Dry eye syndrome of bilateral lacrimal glands: Secondary | ICD-10-CM | POA: Diagnosis not present

## 2016-08-03 DIAGNOSIS — M545 Low back pain: Secondary | ICD-10-CM | POA: Diagnosis not present

## 2016-08-11 DIAGNOSIS — M545 Low back pain: Secondary | ICD-10-CM | POA: Diagnosis not present

## 2016-08-18 DIAGNOSIS — M545 Low back pain: Secondary | ICD-10-CM | POA: Diagnosis not present

## 2016-08-24 DIAGNOSIS — N529 Male erectile dysfunction, unspecified: Secondary | ICD-10-CM | POA: Diagnosis not present

## 2016-09-05 DIAGNOSIS — M545 Low back pain: Secondary | ICD-10-CM | POA: Diagnosis not present

## 2018-02-26 DIAGNOSIS — M50323 Other cervical disc degeneration at C6-C7 level: Secondary | ICD-10-CM | POA: Diagnosis not present

## 2018-02-26 DIAGNOSIS — M5412 Radiculopathy, cervical region: Secondary | ICD-10-CM | POA: Diagnosis not present

## 2018-03-07 DIAGNOSIS — M5030 Other cervical disc degeneration, unspecified cervical region: Secondary | ICD-10-CM | POA: Diagnosis not present

## 2018-03-07 DIAGNOSIS — M542 Cervicalgia: Secondary | ICD-10-CM | POA: Diagnosis not present

## 2018-03-07 DIAGNOSIS — M6281 Muscle weakness (generalized): Secondary | ICD-10-CM | POA: Diagnosis not present

## 2018-03-28 DIAGNOSIS — M6281 Muscle weakness (generalized): Secondary | ICD-10-CM | POA: Diagnosis not present

## 2018-03-28 DIAGNOSIS — M542 Cervicalgia: Secondary | ICD-10-CM | POA: Diagnosis not present

## 2018-03-28 DIAGNOSIS — M5030 Other cervical disc degeneration, unspecified cervical region: Secondary | ICD-10-CM | POA: Diagnosis not present

## 2018-04-09 DIAGNOSIS — M542 Cervicalgia: Secondary | ICD-10-CM | POA: Diagnosis not present

## 2018-04-09 DIAGNOSIS — M5030 Other cervical disc degeneration, unspecified cervical region: Secondary | ICD-10-CM | POA: Diagnosis not present

## 2018-04-09 DIAGNOSIS — M6281 Muscle weakness (generalized): Secondary | ICD-10-CM | POA: Diagnosis not present

## 2018-04-23 DIAGNOSIS — M5030 Other cervical disc degeneration, unspecified cervical region: Secondary | ICD-10-CM | POA: Diagnosis not present

## 2018-04-23 DIAGNOSIS — M6281 Muscle weakness (generalized): Secondary | ICD-10-CM | POA: Diagnosis not present

## 2018-04-23 DIAGNOSIS — M542 Cervicalgia: Secondary | ICD-10-CM | POA: Diagnosis not present

## 2019-11-02 DIAGNOSIS — R198 Other specified symptoms and signs involving the digestive system and abdomen: Secondary | ICD-10-CM | POA: Diagnosis not present

## 2019-11-02 DIAGNOSIS — K5900 Constipation, unspecified: Secondary | ICD-10-CM | POA: Diagnosis not present

## 2019-11-02 DIAGNOSIS — N200 Calculus of kidney: Secondary | ICD-10-CM | POA: Diagnosis not present

## 2019-11-04 DIAGNOSIS — N2889 Other specified disorders of kidney and ureter: Secondary | ICD-10-CM | POA: Diagnosis not present

## 2019-11-04 DIAGNOSIS — N200 Calculus of kidney: Secondary | ICD-10-CM | POA: Diagnosis not present

## 2019-11-04 DIAGNOSIS — R310 Gross hematuria: Secondary | ICD-10-CM | POA: Diagnosis not present

## 2019-11-04 DIAGNOSIS — R109 Unspecified abdominal pain: Secondary | ICD-10-CM | POA: Diagnosis not present

## 2019-11-05 ENCOUNTER — Emergency Department (HOSPITAL_BASED_OUTPATIENT_CLINIC_OR_DEPARTMENT_OTHER): Payer: PPO

## 2019-11-05 ENCOUNTER — Emergency Department (HOSPITAL_BASED_OUTPATIENT_CLINIC_OR_DEPARTMENT_OTHER)
Admission: EM | Admit: 2019-11-05 | Discharge: 2019-11-05 | Disposition: A | Discharge status: Home / Self Care | Payer: PPO | Attending: Emergency Medicine | Admitting: Emergency Medicine

## 2019-11-05 ENCOUNTER — Encounter (HOSPITAL_BASED_OUTPATIENT_CLINIC_OR_DEPARTMENT_OTHER): Payer: Self-pay | Admitting: Emergency Medicine

## 2019-11-05 DIAGNOSIS — N1339 Other hydronephrosis: Secondary | ICD-10-CM | POA: Diagnosis not present

## 2019-11-05 DIAGNOSIS — N200 Calculus of kidney: Secondary | ICD-10-CM

## 2019-11-05 DIAGNOSIS — K5732 Diverticulitis of large intestine without perforation or abscess without bleeding: Secondary | ICD-10-CM | POA: Insufficient documentation

## 2019-11-05 DIAGNOSIS — N132 Hydronephrosis with renal and ureteral calculous obstruction: Secondary | ICD-10-CM | POA: Diagnosis not present

## 2019-11-05 DIAGNOSIS — R1032 Left lower quadrant pain: Secondary | ICD-10-CM | POA: Diagnosis present

## 2019-11-05 LAB — CBC WITH DIFFERENTIAL/PLATELET
Abs Immature Granulocytes: 0.01 10*3/uL (ref 0.00–0.07)
Basophils Absolute: 0 10*3/uL (ref 0.0–0.1)
Basophils Relative: 0 %
Eosinophils Absolute: 0.1 10*3/uL (ref 0.0–0.5)
Eosinophils Relative: 1 %
HCT: 38.5 % — ABNORMAL LOW (ref 39.0–52.0)
Hemoglobin: 12.8 g/dL — ABNORMAL LOW (ref 13.0–17.0)
Immature Granulocytes: 0 %
Lymphocytes Relative: 18 %
Lymphs Abs: 1.6 10*3/uL (ref 0.7–4.0)
MCH: 32.2 pg (ref 26.0–34.0)
MCHC: 33.2 g/dL (ref 30.0–36.0)
MCV: 96.7 fL (ref 80.0–100.0)
Monocytes Absolute: 1 10*3/uL (ref 0.1–1.0)
Monocytes Relative: 11 %
Neutro Abs: 6.5 10*3/uL (ref 1.7–7.7)
Neutrophils Relative %: 70 %
Platelets: 215 10*3/uL (ref 150–400)
RBC: 3.98 MIL/uL — ABNORMAL LOW (ref 4.22–5.81)
RDW: 11.6 % (ref 11.5–15.5)
WBC: 9.2 10*3/uL (ref 4.0–10.5)
nRBC: 0 % (ref 0.0–0.2)

## 2019-11-05 LAB — BASIC METABOLIC PANEL
Anion gap: 9 (ref 5–15)
BUN: 12 mg/dL (ref 8–23)
CO2: 22 mmol/L (ref 22–32)
Calcium: 8.9 mg/dL (ref 8.9–10.3)
Chloride: 106 mmol/L (ref 98–111)
Creatinine, Ser: 1.09 mg/dL (ref 0.61–1.24)
GFR calc Af Amer: >60 mL/min (ref >60)
GFR calc non Af Amer: >60 mL/min (ref >60)
Glucose, Bld: 128 mg/dL — ABNORMAL HIGH (ref 70–99)
Potassium: 4.9 mmol/L (ref 3.5–5.1)
Sodium: 137 mmol/L (ref 135–145)

## 2019-11-05 MED ORDER — ONDANSETRON 8 MG PO TBDP
ORAL_TABLET | ORAL | 0 refills | Status: DC
Start: 2019-11-05 — End: 2020-06-05

## 2019-11-05 MED ORDER — KETOROLAC TROMETHAMINE 30 MG/ML IJ SOLN
30.0000 mg | Freq: Once | INTRAMUSCULAR | Status: AC
  Administered 2019-11-05: 30 mg via INTRAVENOUS
  Filled 2019-11-05: qty 1

## 2019-11-05 MED ORDER — DICLOFENAC SODIUM ER 100 MG PO TB24: 100.0000 mg | ORAL_TABLET | Freq: Every day | ORAL | 0 refills | Status: DC

## 2019-11-05 MED ORDER — TAMSULOSIN HCL 0.4 MG PO CAPS
0.4000 mg | ORAL_CAPSULE | ORAL | Status: AC
  Administered 2019-11-05: 0.4 mg via ORAL
  Filled 2019-11-05: qty 1

## 2019-11-05 MED ORDER — ONDANSETRON HCL 4 MG/2ML IJ SOLN
4.0000 mg | Freq: Once | INTRAMUSCULAR | Status: AC
  Administered 2019-11-05: 4 mg via INTRAVENOUS
  Filled 2019-11-05: qty 2

## 2019-11-05 NOTE — ED Triage Notes (Signed)
Pt is c/o left flank pain that started on Thursday worse tonight  Pt states he took a norco around 10 pm tonight without relief  Pt was diagnosed with a kidney stone by his doctor

## 2019-11-05 NOTE — ED Provider Notes (Signed)
[facility] EMERGENCY DEPARTMENT Provider Note   CSN: [id number] Arrival date & time: 11/05/19  0245     History Chief Complaint  Patient presents with  . Flank Pain    Jorge Rodriguez is a 69 y.o. male.  The history is provided by the patient.  Flank Pain This is a new problem. The current episode started more than 1 week ago (one week ago). Episode frequency: intermittently  The problem has been gradually worsening. Pertinent negatives include no chest pain, no headaches and no shortness of breath. Nothing aggravates the symptoms. Nothing relieves the symptoms. Treatments tried: home narcotics prescribed by PMD. The treatment provided no relief.  Seen over the weekend and diagnosed with UTI and started on levaquin but symptoms persisted and PMD started patient on flomax and narcotic pain medication based on symptoms and KUB.       Past Medical History:  Diagnosis Date  . Arthritis   . History of kidney stones     There are no problems to display for this patient.   Past Surgical History:  Procedure Laterality Date  . TENDON REPAIR Left 01/31/2014   Procedure: TENDON REPAIR;  Surgeon: Sharma Covert, MD;  Location: [facility] OR;  Service: Orthopedics;  Laterality: Left;  wants to follow  . WISDOM TOOTH EXTRACTION         Family History  Problem Relation Age of Onset  . Diabetes Father   . Diabetes Sister     Social History   Tobacco Use  . Smoking status: Never Smoker  . Smokeless tobacco: Never Used  Substance Use Topics  . Alcohol use: No    Comment: rare  . Drug use: No    Home Medications Prior to Admission medications   Medication Sig Start Date End Date Taking? Authorizing Provider  HYDROcodone-acetaminophen (NORCO) 7.5-325 MG tablet Take 1 tablet by mouth every 6 (six) hours as needed for moderate pain.   Yes [provider]  tamsulosin (FLOMAX) 0.4 MG CAPS capsule Take 0.4 mg by mouth daily.   Yes [provider]  cephALEXin  (KEFLEX) 500 MG capsule Take 1 capsule (500 mg total) by mouth 4 (four) times daily. 01/31/14   Bradly Bienenstock, MD  Diclofenac Sodium CR 100 MG 24 hr tablet Take 1 tablet (100 mg total) by mouth daily. 11/05/19   Mycal Conde, MD  docusate sodium (COLACE) 100 MG capsule Take 1 capsule (100 mg total) by mouth 2 (two) times daily. 01/31/14   Bradly Bienenstock, MD  ondansetron (ZOFRAN ODT) 8 MG disintegrating tablet 8mg  ODT q8 hours prn nausea 11/05/19   Naida Escalante, MD  oxyCODONE-acetaminophen (PERCOCET) 5-325 MG per tablet Take 1-2 tablets by mouth every 6 (six) hours as needed for severe pain. 01/31/14   02/02/14, MD    Allergies    Patient has no known allergies.  Review of Systems   Review of Systems  Constitutional: Negative for fever.  HENT: Negative for congestion.   Eyes: Negative for visual disturbance.  Respiratory: Negative for shortness of breath.   Cardiovascular: Negative for chest pain.  Gastrointestinal: Negative for nausea and vomiting.  Genitourinary: Positive for flank pain. Negative for hematuria.  Musculoskeletal: Negative for arthralgias.  Skin: Negative for rash.  Neurological: Negative for headaches.  Psychiatric/Behavioral: Negative for agitation.  All other systems reviewed and are negative.   Physical Exam Updated Vital Signs BP (!) 164/95 (BP Location: Right Arm)   Pulse 88   Temp 98.7 F (37.1 C) (Oral)  Resp 20   Ht 5\' 10"  (1.778 m)   Wt 87.1 kg   SpO2 98%   BMI 27.55 kg/m   Physical Exam Vitals and nursing note reviewed.  Constitutional:      General: He is not in acute distress.    Appearance: Normal appearance.  HENT:     Head: Normocephalic and atraumatic.     Nose: Nose normal.  Eyes:     Pupils: Pupils are equal, round, and reactive to light.  Cardiovascular:     Rate and Rhythm: Normal rate and regular rhythm.     Pulses: Normal pulses.     Heart sounds: Normal heart sounds.  Pulmonary:     Effort: Pulmonary effort is normal.       Breath sounds: Normal breath sounds.  Abdominal:     General: Abdomen is flat. Bowel sounds are normal.     Tenderness: There is no abdominal tenderness. There is no guarding or rebound.  Musculoskeletal:        General: Normal range of motion.     Cervical back: Normal range of motion and neck supple.  Skin:    General: Skin is warm and dry.     Capillary Refill: Capillary refill takes less than 2 seconds.  Neurological:     General: No focal deficit present.     Mental Status: He is alert and oriented to person, place, and time.     Deep Tendon Reflexes: Reflexes normal.  Psychiatric:        Mood and Affect: Mood normal.        Behavior: Behavior normal.     ED Results / Procedures / Treatments   Labs (all labs ordered are listed, but only abnormal results are displayed) Results for orders placed or performed during the hospital encounter of 11/05/19  CBC with Differential/Platelet  Result Value Ref Range   WBC 9.2 4.0 - 10.5 K/uL   RBC 3.98 (L) 4.22 - 5.81 MIL/uL   Hemoglobin 12.8 (L) 13.0 - 17.0 g/dL   HCT 11/07/19 (L) 62.7 - 03.5 %   MCV 96.7 80.0 - 100.0 fL   MCH 32.2 26.0 - 34.0 pg   MCHC 33.2 30.0 - 36.0 g/dL   RDW 00.9 38.1 - 82.9 %   Platelets 215 150 - 400 K/uL   nRBC 0.0 0.0 - 0.2 %   Neutrophils Relative % 70 %   Neutro Abs 6.5 1.7 - 7.7 K/uL   Lymphocytes Relative 18 %   Lymphs Abs 1.6 0.7 - 4.0 K/uL   Monocytes Relative 11 %   Monocytes Absolute 1.0 0.1 - 1.0 K/uL   Eosinophils Relative 1 %   Eosinophils Absolute 0.1 0.0 - 0.5 K/uL   Basophils Relative 0 %   Basophils Absolute 0.0 0.0 - 0.1 K/uL   Immature Granulocytes 0 %   Abs Immature Granulocytes 0.01 0.00 - 0.07 K/uL  Basic metabolic panel  Result Value Ref Range   Sodium 137 135 - 145 mmol/L   Potassium 4.9 3.5 - 5.1 mmol/L   Chloride 106 98 - 111 mmol/L   CO2 22 22 - 32 mmol/L   Glucose, Bld 128 (H) 70 - 99 mg/dL   BUN 12 8 - 23 mg/dL   Creatinine, Ser 93.7 0.61 - 1.24 mg/dL   Calcium  8.9 8.9 - 1.69 mg/dL   GFR calc non Af Amer >60 >60 mL/min   GFR calc Af Amer >60 >60 mL/min   Anion gap 9 5 -  15   No results found.  Radiology See CT reading in Epic   Procedures Procedures (including critical care time)  Medications Ordered in ED Medications  ondansetron (ZOFRAN) injection 4 mg (4 mg Intravenous Given 11/05/19 0300)  ketorolac (TORADOL) 30 MG/ML injection 30 mg (30 mg Intravenous Given 11/05/19 0300)  tamsulosin (FLOMAX) capsule 0.4 mg (0.4 mg Oral Given 11/05/19 0301)    ED Course  I have reviewed the triage vital signs and the nursing notes.  Pertinent labs & imaging results that were available during my care of the patient were reviewed by me and considered in my medical decision making (see chart for details).  Pain markedly improved post medication.     Patient was just placed on vicodin and flomax.  He may continue same.  I will start voltaren and refer to urology for ongoing care.  Patient has been given a strainer to strain all urine.    Jorge Rodriguez was evaluated in Emergency Department on 11/05/2019 for the symptoms described in the history of present illness. He was evaluated in the context of the global COVID-19 pandemic, which necessitated consideration that the patient might be at risk for infection with the SARS-CoV-2 virus that causes COVID-19. Institutional protocols and algorithms that pertain to the evaluation of patients at risk for COVID-19 are in a state of rapid change based on information released by regulatory bodies including the CDC and federal and state organizations. These policies and algorithms were followed during the patient's care in the ED.   Final Clinical Impression(s) / ED Diagnoses Final diagnoses:  None   Return for intractable cough, coughing up blood,fevers >100.4 unrelieved by medication, shortness of breath, intractable vomiting, chest pain, shortness of breath, weakness,numbness, changes in speech, facial  asymmetry,abdominal pain, passing out,Inability to tolerate liquids or food, cough, altered mental status or any concerns. No signs of systemic illness or infection. The patient is nontoxic-appearing on exam and vital signs are within normal limits.   I have reviewed the triage vital signs and the nursing notes. Pertinent labs &imaging results that were available during my care of the patient were reviewed by me and considered in my medical decision making (see chart for details).After history, exam, and medical workup I feel the patient has beenappropriately medically screened and is safe for discharge home. Pertinent diagnoses were discussed with the patient. Patient was given return precautions. Rx / DC Orders ED Discharge Orders         Ordered    Diclofenac Sodium CR 100 MG 24 hr tablet  Daily     11/05/19 0306    ondansetron (ZOFRAN ODT) 8 MG disintegrating tablet     11/05/19 0306           Becker Christopher, MD 11/05/19 0335

## 2020-05-10 DIAGNOSIS — H04123 Dry eye syndrome of bilateral lacrimal glands: Secondary | ICD-10-CM | POA: Diagnosis not present

## 2020-06-05 ENCOUNTER — Emergency Department (HOSPITAL_BASED_OUTPATIENT_CLINIC_OR_DEPARTMENT_OTHER)
Admission: EM | Admit: 2020-06-05 | Discharge: 2020-06-05 | Disposition: A | Discharge status: Home / Self Care | Payer: PPO | Attending: Emergency Medicine | Admitting: Emergency Medicine

## 2020-06-05 ENCOUNTER — Encounter (HOSPITAL_BASED_OUTPATIENT_CLINIC_OR_DEPARTMENT_OTHER): Payer: Self-pay | Admitting: Emergency Medicine

## 2020-06-05 ENCOUNTER — Emergency Department (HOSPITAL_BASED_OUTPATIENT_CLINIC_OR_DEPARTMENT_OTHER): Payer: PPO

## 2020-06-05 DIAGNOSIS — N200 Calculus of kidney: Secondary | ICD-10-CM

## 2020-06-05 DIAGNOSIS — K5900 Constipation, unspecified: Secondary | ICD-10-CM | POA: Insufficient documentation

## 2020-06-05 DIAGNOSIS — N132 Hydronephrosis with renal and ureteral calculous obstruction: Secondary | ICD-10-CM | POA: Diagnosis not present

## 2020-06-05 DIAGNOSIS — K429 Umbilical hernia without obstruction or gangrene: Secondary | ICD-10-CM | POA: Diagnosis not present

## 2020-06-05 DIAGNOSIS — I700 Atherosclerosis of aorta: Secondary | ICD-10-CM | POA: Diagnosis not present

## 2020-06-05 DIAGNOSIS — K449 Diaphragmatic hernia without obstruction or gangrene: Secondary | ICD-10-CM | POA: Diagnosis not present

## 2020-06-05 DIAGNOSIS — R109 Unspecified abdominal pain: Secondary | ICD-10-CM | POA: Diagnosis present

## 2020-06-05 LAB — URINALYSIS, MICROSCOPIC (REFLEX): RBC / HPF: >50 RBC/hpf (ref 0–5)

## 2020-06-05 LAB — COMPREHENSIVE METABOLIC PANEL
ALT: 18 U/L (ref 0–44)
AST: 21 U/L (ref 15–41)
Albumin: 4.5 g/dL (ref 3.5–5.0)
Alkaline Phosphatase: 40 U/L (ref 38–126)
Anion gap: 7 (ref 5–15)
BUN: 18 mg/dL (ref 8–23)
CO2: 27 mmol/L (ref 22–32)
Calcium: 9.1 mg/dL (ref 8.9–10.3)
Chloride: 104 mmol/L (ref 98–111)
Creatinine, Ser: 1.19 mg/dL (ref 0.61–1.24)
GFR, Estimated: >60 mL/min (ref >60)
Glucose, Bld: 156 mg/dL — ABNORMAL HIGH (ref 70–99)
Potassium: 4.3 mmol/L (ref 3.5–5.1)
Sodium: 138 mmol/L (ref 135–145)
Total Bilirubin: 1 mg/dL (ref 0.3–1.2)
Total Protein: 7 g/dL (ref 6.5–8.1)

## 2020-06-05 LAB — URINALYSIS, ROUTINE W REFLEX MICROSCOPIC
Bilirubin Urine: NEGATIVE
Glucose, UA: NEGATIVE mg/dL
Ketones, ur: NEGATIVE mg/dL
Leukocytes,Ua: NEGATIVE
Nitrite: NEGATIVE
Protein, ur: NEGATIVE mg/dL
Specific Gravity, Urine: >1.03 — ABNORMAL HIGH (ref 1.005–1.030)
pH: 6 (ref 5.0–8.0)

## 2020-06-05 LAB — CBC WITH DIFFERENTIAL/PLATELET
Abs Immature Granulocytes: 0.03 10*3/uL (ref 0.00–0.07)
Basophils Absolute: 0 10*3/uL (ref 0.0–0.1)
Basophils Relative: 0 %
Eosinophils Absolute: 0.1 10*3/uL (ref 0.0–0.5)
Eosinophils Relative: 1 %
HCT: 42.4 % (ref 39.0–52.0)
Hemoglobin: 14.6 g/dL (ref 13.0–17.0)
Immature Granulocytes: 0 %
Lymphocytes Relative: 19 %
Lymphs Abs: 1.7 10*3/uL (ref 0.7–4.0)
MCH: 31.7 pg (ref 26.0–34.0)
MCHC: 34.4 g/dL (ref 30.0–36.0)
MCV: 92 fL (ref 80.0–100.0)
Monocytes Absolute: 0.6 10*3/uL (ref 0.1–1.0)
Monocytes Relative: 7 %
Neutro Abs: 6.6 10*3/uL (ref 1.7–7.7)
Neutrophils Relative %: 73 %
Platelets: 309 10*3/uL (ref 150–400)
RBC: 4.61 MIL/uL (ref 4.22–5.81)
RDW: 11.9 % (ref 11.5–15.5)
WBC: 9 10*3/uL (ref 4.0–10.5)
nRBC: 0 % (ref 0.0–0.2)

## 2020-06-05 LAB — LIPASE, BLOOD: Lipase: 29 U/L (ref 11–51)

## 2020-06-05 MED ORDER — KETOROLAC TROMETHAMINE 30 MG/ML IJ SOLN
30.0000 mg | Freq: Once | INTRAMUSCULAR | Status: AC
  Administered 2020-06-05: 30 mg via INTRAVENOUS
  Filled 2020-06-05: qty 1

## 2020-06-05 MED ORDER — PROCHLORPERAZINE EDISYLATE 10 MG/2ML IJ SOLN: 10.0000 mg | Freq: Once | INTRAMUSCULAR | Status: DC

## 2020-06-05 MED ORDER — OXYCODONE-ACETAMINOPHEN 5-325 MG PO TABS: 2.0000 | ORAL_TABLET | ORAL | 0 refills | Status: DC | PRN

## 2020-06-05 MED ORDER — SODIUM CHLORIDE 0.9 % IV BOLUS
1000.0000 mL | Freq: Once | INTRAVENOUS | Status: AC
  Administered 2020-06-05: 1000 mL via INTRAVENOUS

## 2020-06-05 MED ORDER — DIPHENHYDRAMINE HCL 50 MG/ML IJ SOLN: 25.0000 mg | Freq: Once | INTRAMUSCULAR | Status: DC

## 2020-06-05 MED ORDER — METOCLOPRAMIDE HCL 5 MG/ML IJ SOLN
10.0000 mg | Freq: Once | INTRAMUSCULAR | Status: AC
  Administered 2020-06-05: 10 mg via INTRAVENOUS
  Filled 2020-06-05: qty 2

## 2020-06-05 MED ORDER — DICLOFENAC SODIUM ER 100 MG PO TB24: 100.0000 mg | ORAL_TABLET | Freq: Every day | ORAL | 0 refills | Status: DC

## 2020-06-05 MED ORDER — DEXAMETHASONE SODIUM PHOSPHATE 10 MG/ML IJ SOLN: 10.0000 mg | Freq: Once | INTRAMUSCULAR | Status: DC

## 2020-06-05 MED ORDER — ONDANSETRON 8 MG PO TBDP: ORAL_TABLET | ORAL | 0 refills | Status: DC

## 2020-06-05 MED ORDER — TAMSULOSIN HCL 0.4 MG PO CAPS: 0.4000 mg | ORAL_CAPSULE | Freq: Every day | ORAL | Status: DC

## 2020-06-05 MED ORDER — HYDROMORPHONE HCL 1 MG/ML IJ SOLN
1.0000 mg | Freq: Once | INTRAMUSCULAR | Status: AC
  Administered 2020-06-05: 1 mg via INTRAVENOUS
  Filled 2020-06-05: qty 1

## 2020-06-05 MED ORDER — DOCUSATE SODIUM 100 MG PO CAPS
100.0000 mg | ORAL_CAPSULE | Freq: Two times a day (BID) | ORAL | 0 refills | Status: DC
Start: 2020-06-05 — End: 2023-09-06

## 2020-06-05 NOTE — ED Provider Notes (Signed)
[facility] EMERGENCY DEPARTMENT Provider Note   CSN: [id number] Arrival date & time: 06/05/20  6579     History Chief Complaint  Patient presents with  . Flank Pain    Jorge Rodriguez is a 69 y.o. male.   Flank Pain This is a recurrent problem. The current episode started 1 to 2 hours ago. The problem occurs constantly. The problem has not changed since onset.Associated symptoms include abdominal pain. Nothing aggravates the symptoms. Nothing relieves the symptoms. He has tried nothing for the symptoms. The treatment provided no relief.       Past Medical History:  Diagnosis Date  . Arthritis   . History of kidney stones     There are no problems to display for this patient.   Past Surgical History:  Procedure Laterality Date  . TENDON REPAIR Left 01/31/2014   Procedure: TENDON REPAIR;  Surgeon: Sharma Covert, MD;  Location: [facility] OR;  Service: Orthopedics;  Laterality: Left;  wants to follow  . WISDOM TOOTH EXTRACTION         Family History  Problem Relation Age of Onset  . Diabetes Father   . Diabetes Sister     Social History   Tobacco Use  . Smoking status: Never Smoker  . Smokeless tobacco: Never Used  Substance Use Topics  . Alcohol use: No    Comment: rare  . Drug use: No    Home Medications Prior to Admission medications   Medication Sig Start Date End Date Taking? Authorizing Provider  Diclofenac Sodium CR 100 MG 24 hr tablet Take 1 tablet (100 mg total) by mouth daily. 06/05/20   Rashon Westrup, Barbara Cower, MD  docusate sodium (COLACE) 100 MG capsule Take 1 capsule (100 mg total) by mouth 2 (two) times daily. 06/05/20   Theseus Birnie, Barbara Cower, MD  ondansetron (ZOFRAN ODT) 8 MG disintegrating tablet 8mg  ODT q4 hours prn nausea 06/05/20   Maxx Pham, 06/07/20, MD  oxyCODONE-acetaminophen (PERCOCET) 5-325 MG tablet Take 2 tablets by mouth every 4 (four) hours as needed. 06/05/20   Tracie Lindbloom, 06/07/20, MD  tamsulosin (FLOMAX) 0.4 MG CAPS capsule Take 1 capsule (0.4 mg  total) by mouth daily. 06/05/20   Jassmine Vandruff, 06/07/20, MD    Allergies    Patient has no known allergies.  Review of Systems   Review of Systems  Gastrointestinal: Positive for abdominal pain, constipation and vomiting.  Genitourinary: Positive for flank pain.  All other systems reviewed and are negative.   Physical Exam Updated Vital Signs BP 116/81   Pulse 62   Temp 97.7 F (36.5 C) (Oral)   Resp 16   Ht 5\' 10"  (1.778 m)   Wt 83.9 kg   SpO2 97%   BMI 26.54 kg/m   Physical Exam Vitals and nursing note reviewed.  Constitutional:      Appearance: He is well-developed and well-nourished.     Comments: In distress 2/2 pain Also with active vomiting in the room  HENT:     Head: Normocephalic and atraumatic.     Mouth/Throat:     Mouth: Mucous membranes are moist.  Eyes:     Pupils: Pupils are equal, round, and reactive to light.  Cardiovascular:     Rate and Rhythm: Normal rate.  Pulmonary:     Effort: Pulmonary effort is normal. No respiratory distress.  Abdominal:     General: There is no distension.  Musculoskeletal:        General: Normal range of motion.     Cervical  back: Normal range of motion.  Skin:    General: Skin is warm and dry.  Neurological:     General: No focal deficit present.     Mental Status: He is alert.     ED Results / Procedures / Treatments   Labs (all labs ordered are listed, but only abnormal results are displayed) Labs Reviewed  URINALYSIS, ROUTINE W REFLEX MICROSCOPIC - Abnormal; Notable for the following components:      Result Value   APPearance HAZY (*)    Specific Gravity, Urine >1.030 (*)    Hgb urine dipstick LARGE (*)    All other components within normal limits  COMPREHENSIVE METABOLIC PANEL - Abnormal; Notable for the following components:   Glucose, Bld 156 (*)    All other components within normal limits  URINALYSIS, MICROSCOPIC (REFLEX) - Abnormal; Notable for the following components:   Bacteria, UA MANY (*)    All  other components within normal limits  URINE CULTURE  CBC WITH DIFFERENTIAL/PLATELET  LIPASE, BLOOD    EKG None  Radiology CT Renal Stone Study  Result Date: 06/05/2020 CLINICAL DATA:  Right flank pain. EXAM: CT ABDOMEN AND PELVIS WITHOUT CONTRAST TECHNIQUE: Multidetector CT imaging of the abdomen and pelvis was performed following the standard protocol without IV contrast. COMPARISON:  Nov 05, 2019 FINDINGS: Lower chest: Small hiatal hernia.  No other abnormalities. Hepatobiliary: No focal liver abnormality is seen. No gallstones, gallbladder wall thickening, or biliary dilatation. Pancreas: Unremarkable. No pancreatic ductal dilatation or surrounding inflammatory changes. Spleen: Normal in size without focal abnormality. Adrenals/Urinary Tract: Adrenal glands are normal. 2 mm stone in the upper pole of the right kidney on coronal image 64. Possible subtle 3 or 4 mm stone in the lower pole on coronal image 60. No stones on the left. New mild hydronephrosis on the right with right ureterectasis due to a 3 mm stone in the distal right ureter. Hydronephrosis on the left has decreased in the interval. There may be some peripelvic cysts. The left ureter is now normal in caliber in the previously identified stone has passed. Perinephric stranding seen previously on the left is decreased. The bladder is decompressed but grossly unremarkable. Stomach/Bowel: There is a small hiatal hernia. The stomach and small bowel are otherwise normal. A few scattered colonic diverticuli are seen without diverticulitis. The appendix is normal. Vascular/Lymphatic: Calcified atherosclerosis is seen in the nonaneurysmal aorta. No adenopathy. Reproductive: Prostate is unremarkable. Other: No free air or free fluid. There is a small fat containing periumbilical hernia. Musculoskeletal: No acute or significant osseous findings. IMPRESSION: 1. There is a 3 mm stone in the distal right ureter resulting in mild right hydronephrosis  and ureterectasis. 2. There are 1 or 2 stones in the right kidney. No stones seen in the left kidney. 3. Probable parapelvic cysts on the left. There may also be hydronephrosis, improved in the interval, from previous obstruction. No sign for current obstruction on the left. 4. No other acute abnormalities. Electronically Signed   By: Gerome Sam III M.D   On: 06/05/2020 09:47    Procedures Procedures (including critical care time)  Medications Ordered in ED Medications  sodium chloride 0.9 % bolus 1,000 mL (0 mLs Intravenous Stopped 06/05/20 1011)  metoCLOPramide (REGLAN) injection 10 mg (10 mg Intravenous Given 06/05/20 0905)  HYDROmorphone (DILAUDID) injection 1 mg (1 mg Intravenous Given 06/05/20 0905)  ketorolac (TORADOL) 30 MG/ML injection 30 mg (30 mg Intravenous Given 06/05/20 1106)    ED Course  I  have reviewed the triage vital signs and the nursing notes.  Pertinent labs & imaging results that were available during my care of the patient were reviewed by me and considered in my medical decision making (see chart for details).  Clinical Course as of 06/05/20 1249  Sat Jun 05, 2020  0911 Bacteria, UA(!): MANY Culture added on, will likely start abx as well in setting of likely kidney stone [JM]  0912 WBC: 9.0 Reassuring [JM]  0912 Hgb urine dipstick(!): LARGE Reinforces kidney stone possibility [JM]    Clinical Course User Index [JM] Kaydenn Mclear, Barbara Cower, MD   MDM Rules/Calculators/A&P                         H/o multiple kidney stones. Here with recurrent stone, left side but has some constipation as well. Will treat symptomatically. CT to verify stone and rule out obstruction/volvulus.  Ct verifies kidney stone. Symptomatically improved. Cultured urine, no other e/o sepsis or infection to need abx.   Final Clinical Impression(s) / ED Diagnoses Final diagnoses:  Kidney stone    Rx / DC Orders ED Discharge Orders         Ordered    oxyCODONE-acetaminophen (PERCOCET)  5-325 MG tablet  Every 4 hours PRN        06/05/20 1132    Diclofenac Sodium CR 100 MG 24 hr tablet  Daily        06/05/20 1132    docusate sodium (COLACE) 100 MG capsule  2 times daily        06/05/20 1132    tamsulosin (FLOMAX) 0.4 MG CAPS capsule  Daily        06/05/20 1132    ondansetron (ZOFRAN ODT) 8 MG disintegrating tablet        06/05/20 1132           Edu On, Barbara Cower, MD 06/05/20 1249

## 2020-06-05 NOTE — ED Triage Notes (Signed)
R flank pain with N/V since 5am.

## 2020-06-06 ENCOUNTER — Telehealth: Payer: Self-pay

## 2020-06-06 LAB — URINE CULTURE: Culture: NO GROWTH

## 2020-06-06 NOTE — Telephone Encounter (Signed)
Pharmacy called due to percocet prescription not meeting regulation, changed to one tablet Q4hours from 2 tablets Q4h, maintained quantity at 20 tablets total with no refills.

## 2020-11-22 DIAGNOSIS — M545 Low back pain, unspecified: Secondary | ICD-10-CM | POA: Diagnosis not present

## 2020-11-22 DIAGNOSIS — R030 Elevated blood-pressure reading, without diagnosis of hypertension: Secondary | ICD-10-CM | POA: Diagnosis not present

## 2020-11-22 DIAGNOSIS — N529 Male erectile dysfunction, unspecified: Secondary | ICD-10-CM | POA: Diagnosis not present

## 2021-05-04 DIAGNOSIS — N200 Calculus of kidney: Secondary | ICD-10-CM | POA: Diagnosis not present

## 2021-05-06 ENCOUNTER — Other Ambulatory Visit: Payer: Self-pay

## 2021-05-06 ENCOUNTER — Encounter (HOSPITAL_BASED_OUTPATIENT_CLINIC_OR_DEPARTMENT_OTHER): Payer: Self-pay

## 2021-05-06 ENCOUNTER — Observation Stay (HOSPITAL_COMMUNITY): Payer: PPO

## 2021-05-06 ENCOUNTER — Observation Stay (HOSPITAL_BASED_OUTPATIENT_CLINIC_OR_DEPARTMENT_OTHER)
Admission: EM | Admit: 2021-05-06 | Discharge: 2021-05-07 | Disposition: A | Discharge status: Home / Self Care | Payer: PPO | Attending: Internal Medicine | Admitting: Internal Medicine

## 2021-05-06 ENCOUNTER — Emergency Department (HOSPITAL_BASED_OUTPATIENT_CLINIC_OR_DEPARTMENT_OTHER): Payer: PPO

## 2021-05-06 DIAGNOSIS — N179 Acute kidney failure, unspecified: Secondary | ICD-10-CM | POA: Diagnosis not present

## 2021-05-06 DIAGNOSIS — Z20822 Contact with and (suspected) exposure to covid-19: Secondary | ICD-10-CM | POA: Insufficient documentation

## 2021-05-06 DIAGNOSIS — R030 Elevated blood-pressure reading, without diagnosis of hypertension: Secondary | ICD-10-CM | POA: Insufficient documentation

## 2021-05-06 DIAGNOSIS — R109 Unspecified abdominal pain: Secondary | ICD-10-CM

## 2021-05-06 DIAGNOSIS — K5900 Constipation, unspecified: Secondary | ICD-10-CM | POA: Diagnosis not present

## 2021-05-06 DIAGNOSIS — M47816 Spondylosis without myelopathy or radiculopathy, lumbar region: Secondary | ICD-10-CM | POA: Diagnosis not present

## 2021-05-06 DIAGNOSIS — M48061 Spinal stenosis, lumbar region without neurogenic claudication: Secondary | ICD-10-CM | POA: Diagnosis not present

## 2021-05-06 DIAGNOSIS — N1330 Unspecified hydronephrosis: Secondary | ICD-10-CM | POA: Diagnosis not present

## 2021-05-06 DIAGNOSIS — N3289 Other specified disorders of bladder: Secondary | ICD-10-CM | POA: Diagnosis not present

## 2021-05-06 DIAGNOSIS — N139 Obstructive and reflux uropathy, unspecified: Secondary | ICD-10-CM

## 2021-05-06 DIAGNOSIS — Z87442 Personal history of urinary calculi: Secondary | ICD-10-CM | POA: Diagnosis not present

## 2021-05-06 DIAGNOSIS — R10A2 Flank pain, left side: Secondary | ICD-10-CM

## 2021-05-06 DIAGNOSIS — M545 Low back pain, unspecified: Secondary | ICD-10-CM | POA: Diagnosis not present

## 2021-05-06 DIAGNOSIS — R1032 Left lower quadrant pain: Secondary | ICD-10-CM | POA: Diagnosis not present

## 2021-05-06 DIAGNOSIS — K449 Diaphragmatic hernia without obstruction or gangrene: Secondary | ICD-10-CM | POA: Diagnosis not present

## 2021-05-06 LAB — URINALYSIS, MICROSCOPIC (REFLEX)

## 2021-05-06 LAB — BASIC METABOLIC PANEL
Anion gap: 8 (ref 5–15)
BUN: 38 mg/dL — ABNORMAL HIGH (ref 8–23)
CO2: 25 mmol/L (ref 22–32)
Calcium: 9.1 mg/dL (ref 8.9–10.3)
Chloride: 101 mmol/L (ref 98–111)
Creatinine, Ser: 4.02 mg/dL — ABNORMAL HIGH (ref 0.61–1.24)
GFR, Estimated: 15 mL/min — ABNORMAL LOW (ref >60)
Glucose, Bld: 120 mg/dL — ABNORMAL HIGH (ref 70–99)
Potassium: 4.5 mmol/L (ref 3.5–5.1)
Sodium: 134 mmol/L — ABNORMAL LOW (ref 135–145)

## 2021-05-06 LAB — CBC WITH DIFFERENTIAL/PLATELET
Abs Immature Granulocytes: 0.04 10*3/uL (ref 0.00–0.07)
Basophils Absolute: 0 10*3/uL (ref 0.0–0.1)
Basophils Relative: 0 %
Eosinophils Absolute: 0 10*3/uL (ref 0.0–0.5)
Eosinophils Relative: 0 %
HCT: 38.5 % — ABNORMAL LOW (ref 39.0–52.0)
Hemoglobin: 13.1 g/dL (ref 13.0–17.0)
Immature Granulocytes: 0 %
Lymphocytes Relative: 7 %
Lymphs Abs: 0.7 10*3/uL (ref 0.7–4.0)
MCH: 31.6 pg (ref 26.0–34.0)
MCHC: 34 g/dL (ref 30.0–36.0)
MCV: 92.8 fL (ref 80.0–100.0)
Monocytes Absolute: 0.9 10*3/uL (ref 0.1–1.0)
Monocytes Relative: 9 %
Neutro Abs: 8.2 10*3/uL — ABNORMAL HIGH (ref 1.7–7.7)
Neutrophils Relative %: 84 %
Platelets: 233 10*3/uL (ref 150–400)
RBC: 4.15 MIL/uL — ABNORMAL LOW (ref 4.22–5.81)
RDW: 11.9 % (ref 11.5–15.5)
WBC: 10 10*3/uL (ref 4.0–10.5)
nRBC: 0 % (ref 0.0–0.2)

## 2021-05-06 LAB — URINALYSIS, ROUTINE W REFLEX MICROSCOPIC
Bilirubin Urine: NEGATIVE
Glucose, UA: NEGATIVE mg/dL
Ketones, ur: NEGATIVE mg/dL
Leukocytes,Ua: NEGATIVE
Nitrite: NEGATIVE
Protein, ur: NEGATIVE mg/dL
Specific Gravity, Urine: <1.005 — ABNORMAL LOW (ref 1.005–1.030)
pH: 5.5 (ref 5.0–8.0)

## 2021-05-06 LAB — RESP PANEL BY RT-PCR (FLU A&B, COVID) ARPGX2
Influenza A by PCR: NEGATIVE
Influenza B by PCR: NEGATIVE
SARS Coronavirus 2 by RT PCR: NEGATIVE

## 2021-05-06 MED ORDER — SODIUM CHLORIDE 0.9 % IV SOLN: INTRAVENOUS | Status: DC

## 2021-05-06 MED ORDER — TAMSULOSIN HCL 0.4 MG PO CAPS
0.4000 mg | ORAL_CAPSULE | Freq: Every day | ORAL | Status: DC
  Administered 2021-05-06 – 2021-05-07 (×2): 0.4 mg via ORAL
  Filled 2021-05-06 (×2): qty 1

## 2021-05-06 MED ORDER — HEPARIN SODIUM (PORCINE) 5000 UNIT/ML IJ SOLN
5000.0000 [IU] | Freq: Three times a day (TID) | INTRAMUSCULAR | Status: DC
  Administered 2021-05-06 – 2021-05-07 (×2): 5000 [IU] via SUBCUTANEOUS
  Filled 2021-05-06 (×2): qty 1

## 2021-05-06 MED ORDER — SENNOSIDES-DOCUSATE SODIUM 8.6-50 MG PO TABS: 1.0000 | ORAL_TABLET | Freq: Every evening | ORAL | Status: DC | PRN

## 2021-05-06 MED ORDER — MELATONIN 5 MG PO TABS
5.0000 mg | ORAL_TABLET | Freq: Once | ORAL | Status: AC
  Administered 2021-05-06: 5 mg via ORAL
  Filled 2021-05-06: qty 1

## 2021-05-06 MED ORDER — HYDRALAZINE HCL 20 MG/ML IJ SOLN: 5.0000 mg | INTRAMUSCULAR | Status: DC | PRN

## 2021-05-06 MED ORDER — HYDROMORPHONE HCL 1 MG/ML IJ SOLN
1.0000 mg | Freq: Once | INTRAMUSCULAR | Status: AC
  Administered 2021-05-06: 1 mg via INTRAVENOUS
  Filled 2021-05-06: qty 1

## 2021-05-06 MED ORDER — ACETAMINOPHEN 650 MG RE SUPP: 650.0000 mg | Freq: Four times a day (QID) | RECTAL | Status: DC | PRN

## 2021-05-06 MED ORDER — ONDANSETRON HCL 4 MG/2ML IJ SOLN
4.0000 mg | Freq: Once | INTRAMUSCULAR | Status: AC
  Administered 2021-05-06: 4 mg via INTRAVENOUS
  Filled 2021-05-06: qty 2

## 2021-05-06 MED ORDER — ACETAMINOPHEN 325 MG PO TABS: 650.0000 mg | ORAL_TABLET | Freq: Four times a day (QID) | ORAL | Status: DC | PRN

## 2021-05-06 MED ORDER — SODIUM CHLORIDE 0.9 % IV BOLUS
500.0000 mL | Freq: Once | INTRAVENOUS | Status: AC
  Administered 2021-05-06: 500 mL via INTRAVENOUS

## 2021-05-06 NOTE — ED Provider Notes (Addendum)
[facility] EMERGENCY DEPARTMENT Provider Note   CSN: [id number] Arrival date & time: 05/06/21  1131     History Chief Complaint  Patient presents with   Flank Pain    Jorge Rodriguez is a 70 y.o. male.  Patient with a complaint of some urinary urgency and left flank pain starting on Monday.  Symptoms have waxed and waned.  To have gotten worse just recently.  Longstanding history of kidney stones.  Last time was in December 2021.  Patient is always passed the stones.  Never required a stent never required removal of the stones.  Patient was taking Percocet at home for the symptoms and also taking levofloxacin.  He was in [place] and on the way back stopped at an urgent care got a shot of pain medicine and and had the oxycodone leftover from the kidney stone a year ago and the antibiotic left over from a year ago.  Associated with some nausea.  But no vomiting.  Symptoms do remind patient of previous kidney stones.      Past Medical History:  Diagnosis Date   Arthritis    History of kidney stones     There are no problems to display for this patient.   Past Surgical History:  Procedure Laterality Date   TENDON REPAIR Left 01/31/2014   Procedure: TENDON REPAIR;  Surgeon: Linna Hoff, MD;  Location: [facility];  Service: Orthopedics;  Laterality: Left;  wants to follow   WISDOM TOOTH EXTRACTION         Family History  Problem Relation Age of Onset   Diabetes Father    Diabetes Sister     Social History   Tobacco Use   Smoking status: Never   Smokeless tobacco: Never  Substance Use Topics   Alcohol use: No    Comment: rare   Drug use: No    Home Medications Prior to Admission medications   Medication Sig Start Date End Date Taking? Authorizing Provider  Diclofenac Sodium CR 100 MG 24 hr tablet Take 1 tablet (100 mg total) by mouth daily. 06/05/20   Mesner, Corene Cornea, MD  docusate sodium (COLACE) 100 MG capsule Take 1 capsule (100 mg total) by mouth 2 (two)  times daily. 06/05/20   Mesner, Corene Cornea, MD  ondansetron (ZOFRAN ODT) 8 MG disintegrating tablet 8mg  ODT q4 hours prn nausea 06/05/20   Mesner, Corene Cornea, MD  oxyCODONE-acetaminophen (PERCOCET) 5-325 MG tablet Take 2 tablets by mouth every 4 (four) hours as needed. 06/05/20   Mesner, Corene Cornea, MD  tamsulosin (FLOMAX) 0.4 MG CAPS capsule Take 1 capsule (0.4 mg total) by mouth daily. 06/05/20   Mesner, Corene Cornea, MD    Allergies    Patient has no known allergies.  Review of Systems   Review of Systems  Constitutional:  Negative for chills and fever.  HENT:  Negative for ear pain and sore throat.   Eyes:  Negative for pain and visual disturbance.  Respiratory:  Negative for cough and shortness of breath.   Cardiovascular:  Negative for chest pain and palpitations.  Gastrointestinal:  Positive for nausea. Negative for abdominal pain and vomiting.  Genitourinary:  Positive for difficulty urinating and flank pain. Negative for dysuria and hematuria.  Musculoskeletal:  Negative for arthralgias and back pain.  Skin:  Negative for color change and rash.  Neurological:  Negative for seizures and syncope.  All other systems reviewed and are negative.  Physical Exam Updated Vital Signs BP (!) 144/78   Pulse 74  Temp 98.1 F (36.7 C) (Oral)   Resp 17   Ht 1.778 m (5\' 10" )   Wt 81.6 kg   SpO2 95%   BMI 25.83 kg/m   Physical Exam Vitals and nursing note reviewed.  Constitutional:      General: He is in acute distress.     Appearance: Normal appearance. He is well-developed.  HENT:     Head: Normocephalic and atraumatic.  Eyes:     Extraocular Movements: Extraocular movements intact.     Conjunctiva/sclera: Conjunctivae normal.     Pupils: Pupils are equal, round, and reactive to light.  Cardiovascular:     Rate and Rhythm: Normal rate and regular rhythm.     Heart sounds: No murmur heard. Pulmonary:     Effort: Pulmonary effort is normal. No respiratory distress.     Breath sounds: Normal  breath sounds.  Abdominal:     Palpations: Abdomen is soft.     Tenderness: There is no abdominal tenderness.  Musculoskeletal:        General: No swelling.     Cervical back: Normal range of motion and neck supple.  Skin:    General: Skin is warm and dry.     Capillary Refill: Capillary refill takes less than 2 seconds.  Neurological:     General: No focal deficit present.     Mental Status: He is alert and oriented to person, place, and time.  Psychiatric:        Mood and Affect: Mood normal.    ED Results / Procedures / Treatments   Labs (all labs ordered are listed, but only abnormal results are displayed) Labs Reviewed  URINALYSIS, ROUTINE W REFLEX MICROSCOPIC - Abnormal; Notable for the following components:      Result Value   Specific Gravity, Urine <1.005 (*)    Hgb urine dipstick LARGE (*)    All other components within normal limits  URINALYSIS, MICROSCOPIC (REFLEX) - Abnormal; Notable for the following components:   Bacteria, UA FEW (*)    All other components within normal limits  CBC WITH DIFFERENTIAL/PLATELET - Abnormal; Notable for the following components:   RBC 4.15 (*)    HCT 38.5 (*)    Neutro Abs 8.2 (*)    All other components within normal limits  BASIC METABOLIC PANEL - Abnormal; Notable for the following components:   Sodium 134 (*)    Glucose, Bld 120 (*)    BUN 38 (*)    Creatinine, Ser 4.02 (*)    GFR, Estimated 15 (*)    All other components within normal limits    EKG None  Radiology CT Renal Stone Study  Result Date: 05/06/2021 CLINICAL DATA:  Flank pain, inability to urinate EXAM: CT ABDOMEN AND PELVIS WITHOUT CONTRAST TECHNIQUE: Multidetector CT imaging of the abdomen and pelvis was performed following the standard protocol without IV contrast. COMPARISON:  CT abdomen/pelvis 06/05/2020 FINDINGS: Lower chest: The lung bases are clear. The imaged heart is unremarkable. Hepatobiliary: The liver and gallbladder are unremarkable. There is  no biliary ductal dilatation. Pancreas: Unremarkable. Spleen: Unremarkable. Adrenals/Urinary Tract: The adrenals are unremarkable There is mild left worse than right hydronephrosis with left worse than right perinephric stranding. No stones are seen in either kidney or along the course of either ureter. No focal lesions are seen, within the confines of noncontrast technique. The bladder is markedly distended.  No bladder stones are seen. Stomach/Bowel: There is a small hiatal hernia. The stomach is otherwise unremarkable. There is  no evidence of bowel obstruction. There is no abnormal bowel wall thickening or inflammatory change. The appendix is normal. Vascular/Lymphatic: There is scattered calcified atherosclerotic plaque throughout the nonaneurysmal abdominal aorta. There is no abdominal or pelvic lymphadenopathy. Reproductive: The prostate and seminal vesicles are unremarkable. Other: There is no ascites or free air. Musculoskeletal: There is multilevel degenerative change of the lumbar spine. There is likely at least moderate spinal canal stenosis at L3-L4. There is no aggressive osseous lesion. IMPRESSION: 1. Mild left worse than right hydronephrosis with left worse than right perinephric stranding, likely due to the markedly distended bladder; consider decompression. No stones or other obstructing lesions are seen. 2. Likely at least moderate spinal canal stenosis at L3-L4. This may be further assessed with lumbar spine MRI as indicated, given reported history. 3.  Aortic Atherosclerosis (ICD10-I70.0). Electronically Signed   By: Lesia Hausen M.D.   On: 05/06/2021 14:14    Procedures Procedures   Medications Ordered in ED Medications  0.9 %  sodium chloride infusion ( Intravenous New Bag/Given 05/06/21 1322)  sodium chloride 0.9 % bolus 500 mL (0 mLs Intravenous Stopped 05/06/21 1321)  ondansetron (ZOFRAN) injection 4 mg (4 mg Intravenous Given 05/06/21 1246)  HYDROmorphone (DILAUDID) injection 1 mg  (1 mg Intravenous Given 05/06/21 1245)    ED Course  I have reviewed the triage vital signs and the nursing notes.  Pertinent labs & imaging results that were available during my care of the patient were reviewed by me and considered in my medical decision making (see chart for details).    MDM Rules/Calculators/A&P                           Patient's symptoms seem to fit recurrent kidney stone.  Patient's urinalysis with a large amount of hemoglobin.  But the RBCs are only 0-5.  Few bacteria.  Will get stat CT renal study.  We will also get CBC basic metabolic panel.  Will give pain medication antinausea medicine and IV fluids.  CT scan shows a markedly dilated urinary bladder.  Which may explain his urgency to go.  Most likely postobstructive.  Probably the reason for the acute kidney injury.  Which is significant good GFR is 15.  No leukocytosis.  Urine does not appear to be infected.  Electrolytes otherwise normal.  CT renal shows mild left worse than right hydronephrosis with left worse than right perinephric stranding.  Likely due to markedly distended bladder consider decompression.  No stones or other obstructing lesions are seen.  Does have some spinal canal stenosis of L3-L4 however patient is not presenting like he is got cauda equina.  Patient will require Foley catheter to be placed and will require admission for the acute kidney injury.  May require consultation with urology.  Discussed with Dr. Ronne Binning on for urology.  He states that they do not need to be involved with the hospitalization unless there is difficulty getting the Foley catheter in place.  But they will need to see him as an outpatient   Final Clinical Impression(s) / ED Diagnoses Final diagnoses:  Left flank pain  AKI (acute kidney injury) (HCC)  Acute urinary obstruction    Rx / DC Orders ED Discharge Orders     None        Vanetta Mulders, MD 05/06/21 1444    Vanetta Mulders, MD 05/06/21  1511

## 2021-05-06 NOTE — Progress Notes (Signed)
Patient is a 70 year old male with history of kidney stones,BPH  who presented to med [facility] with complaints of urinary urgency, left flank pain which started last Monday.  On presentation he was mildly hypertensive.  CT renal study showed markedly dilated urinary bladder, bilateral hydronephrosis worse on the left, no stones or obstructing lesions.  Labs showed a creatinine in the range of 4.  Patient being admitted for the management of AKI secondary to postobstructive etiology most likely from BPH.  Case was discussed with Dr. Ronne Binning, urology, who recommend outpatient follow-up.  Patient needs Foley catheterization, needs to be admitted to ensure improvement in the kidney function.  Most likely he should be discharged with Foley with outpatient urology follow-up. Med Surg

## 2021-05-06 NOTE — ED Triage Notes (Signed)
Pt reports history of kidney stones. Pt reports pain began Monday night. Pain in left flank area and radiates around to abdomen. Inability to urinate initially and having to strain, now having incontinent episode at night. Pt states he is now constipated. Pt took percocet, left over levofloxacin from last stone and laxative. Last BM Monday. Poor po intake
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
## 2021-05-07 DIAGNOSIS — N139 Obstructive and reflux uropathy, unspecified: Secondary | ICD-10-CM | POA: Diagnosis not present

## 2021-05-07 DIAGNOSIS — N179 Acute kidney failure, unspecified: Secondary | ICD-10-CM | POA: Diagnosis not present

## 2021-05-07 DIAGNOSIS — R109 Unspecified abdominal pain: Secondary | ICD-10-CM | POA: Diagnosis not present

## 2021-05-07 LAB — BASIC METABOLIC PANEL
Anion gap: 7 (ref 5–15)
BUN: 23 mg/dL (ref 8–23)
CO2: 24 mmol/L (ref 22–32)
Calcium: 8.7 mg/dL — ABNORMAL LOW (ref 8.9–10.3)
Chloride: 107 mmol/L (ref 98–111)
Creatinine, Ser: 1.2 mg/dL (ref 0.61–1.24)
GFR, Estimated: >60 mL/min (ref >60)
Glucose, Bld: 103 mg/dL — ABNORMAL HIGH (ref 70–99)
Potassium: 4.1 mmol/L (ref 3.5–5.1)
Sodium: 138 mmol/L (ref 135–145)

## 2021-05-07 LAB — CBC
HCT: 34.5 % — ABNORMAL LOW (ref 39.0–52.0)
Hemoglobin: 11.7 g/dL — ABNORMAL LOW (ref 13.0–17.0)
MCH: 31.9 pg (ref 26.0–34.0)
MCHC: 33.9 g/dL (ref 30.0–36.0)
MCV: 94 fL (ref 80.0–100.0)
Platelets: 209 10*3/uL (ref 150–400)
RBC: 3.67 MIL/uL — ABNORMAL LOW (ref 4.22–5.81)
RDW: 11.9 % (ref 11.5–15.5)
WBC: 6.4 10*3/uL (ref 4.0–10.5)
nRBC: 0 % (ref 0.0–0.2)

## 2021-05-07 LAB — HIV ANTIBODY (ROUTINE TESTING W REFLEX): HIV Screen 4th Generation wRfx: NONREACTIVE

## 2021-05-07 MED ORDER — CHLORHEXIDINE GLUCONATE CLOTH 2 % EX PADS
6.0000 | MEDICATED_PAD | Freq: Every day | CUTANEOUS | Status: DC
  Administered 2021-05-07: 6 via TOPICAL

## 2021-05-07 MED ORDER — SALINE SPRAY 0.65 % NA SOLN
1.0000 | NASAL | Status: DC | PRN
  Filled 2021-05-07: qty 44
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
## 2021-05-07 NOTE — Plan of Care (Signed)
Pt for discharge this afternoon home with spouse.  AVS printed and reviewed with patient and spouse at bedside.  Follow ups and new medications discussed in detail.  Foley care and maintenance education provided, demonstrated for patient and spouse including drainage bag exchange and meatus care. Also reviewed possible complications and what they should report to their doctor (hematuria, fevers, etc..).  Teach back x1 by patient and verbal explanations by patient and spouse.  Instructed to call 9-1-1 in case of emergency.  All questions addressed, both verbalized understanding.  Family to transport home.

## 2021-05-20 DIAGNOSIS — Z09 Encounter for follow-up examination after completed treatment for conditions other than malignant neoplasm: Secondary | ICD-10-CM | POA: Diagnosis not present

## 2021-05-20 DIAGNOSIS — N179 Acute kidney failure, unspecified: Secondary | ICD-10-CM | POA: Diagnosis not present

## 2021-05-24 DIAGNOSIS — R338 Other retention of urine: Secondary | ICD-10-CM | POA: Diagnosis not present

## 2021-05-24 DIAGNOSIS — Z87442 Personal history of urinary calculi: Secondary | ICD-10-CM | POA: Diagnosis not present

## 2021-06-08 ENCOUNTER — Other Ambulatory Visit: Payer: Self-pay

## 2021-06-08 ENCOUNTER — Encounter (HOSPITAL_BASED_OUTPATIENT_CLINIC_OR_DEPARTMENT_OTHER): Payer: Self-pay | Admitting: *Deleted

## 2021-06-08 ENCOUNTER — Emergency Department (HOSPITAL_BASED_OUTPATIENT_CLINIC_OR_DEPARTMENT_OTHER)
Admission: EM | Admit: 2021-06-08 | Discharge: 2021-06-08 | Disposition: A | Discharge status: Home / Self Care | Payer: PPO | Attending: Emergency Medicine | Admitting: Emergency Medicine

## 2021-06-08 DIAGNOSIS — Z5321 Procedure and treatment not carried out due to patient leaving prior to being seen by health care provider: Secondary | ICD-10-CM | POA: Insufficient documentation

## 2021-06-08 DIAGNOSIS — R339 Retention of urine, unspecified: Secondary | ICD-10-CM | POA: Insufficient documentation

## 2021-06-08 DIAGNOSIS — R338 Other retention of urine: Secondary | ICD-10-CM | POA: Diagnosis not present

## 2021-06-08 LAB — URINALYSIS, ROUTINE W REFLEX MICROSCOPIC
Bilirubin Urine: NEGATIVE
Glucose, UA: NEGATIVE mg/dL
Hgb urine dipstick: NEGATIVE
Ketones, ur: NEGATIVE mg/dL
Leukocytes,Ua: NEGATIVE
Nitrite: NEGATIVE
Protein, ur: NEGATIVE mg/dL
Specific Gravity, Urine: 1.02 (ref 1.005–1.030)
pH: 6.5 (ref 5.0–8.0)

## 2021-06-08 NOTE — ED Triage Notes (Signed)
C/o urinary rentention x 5 hrs , catheter removed at urology office today at 9:45 am.

## 2021-06-08 NOTE — ED Notes (Signed)
Pt not wanting to stay to wait to be seen by MD. Pt left ED with wife.

## 2021-06-10 LAB — URINE CULTURE: Culture: NO GROWTH

## 2021-06-14 IMAGING — CT CT RENAL STONE PROTOCOL
2 of 4 series · 16 of 46 positions shown, 18 images · non-contrast
Comparison: November 05, 2019

CLINICAL DATA: Right flank pain.

EXAM:
CT ABDOMEN AND PELVIS WITHOUT CONTRAST
TECHNIQUE: Multidetector CT imaging of the abdomen and pelvis was performed
following the standard protocol without IV contrast.

[Series 2: axial st · axial · 0.90mm/px · z∈[+684,+1094]mm · 13 of 90 slices shown, 15 images]
[im 4/90  soft-tissue]
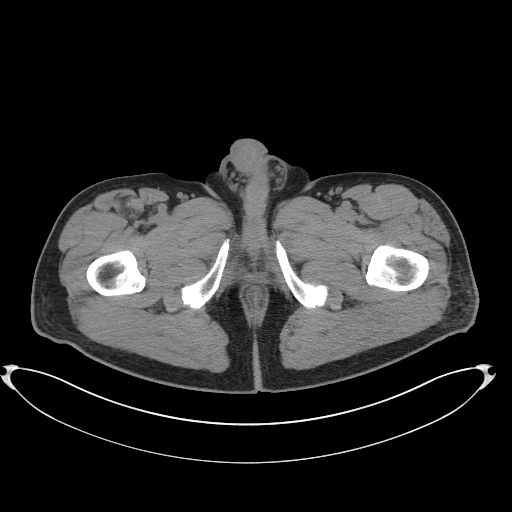
[im 4/90  bone]
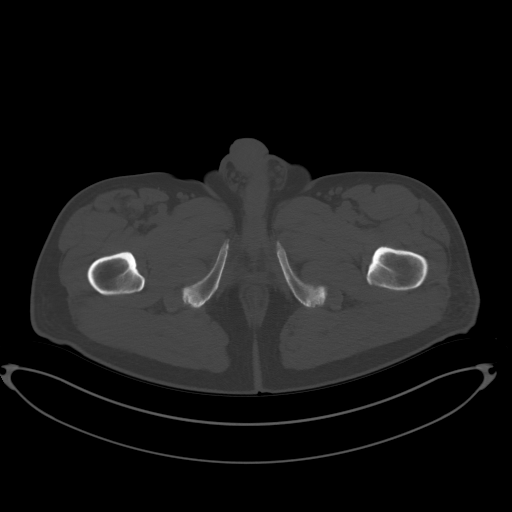
[im 12/90  soft-tissue]
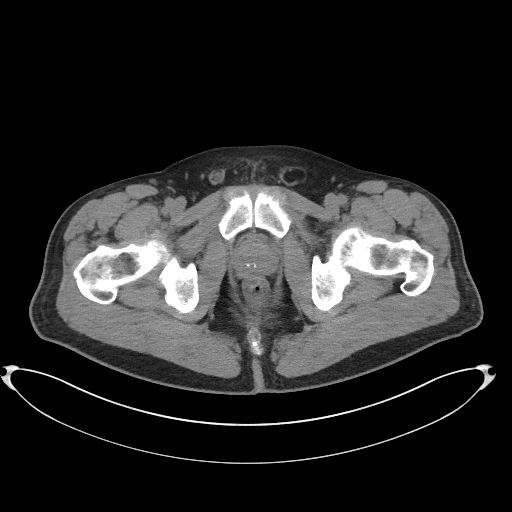
[im 19/90  soft-tissue]
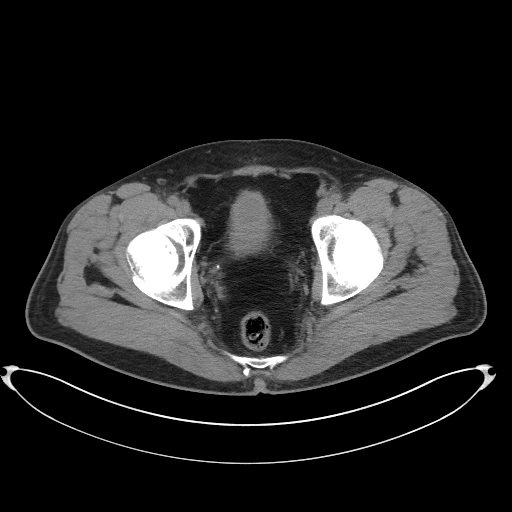
[im 26/90  soft-tissue]
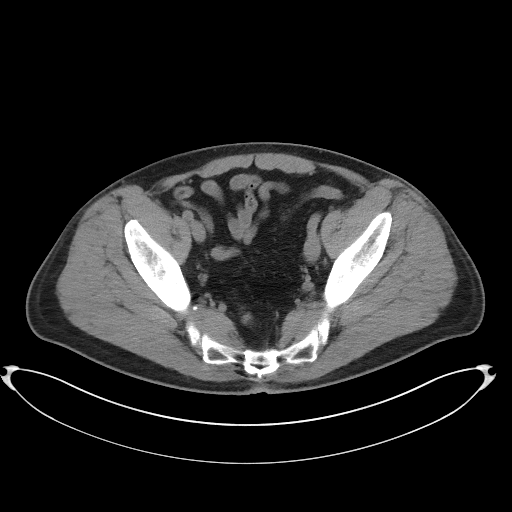
[im 30/90  soft-tissue]
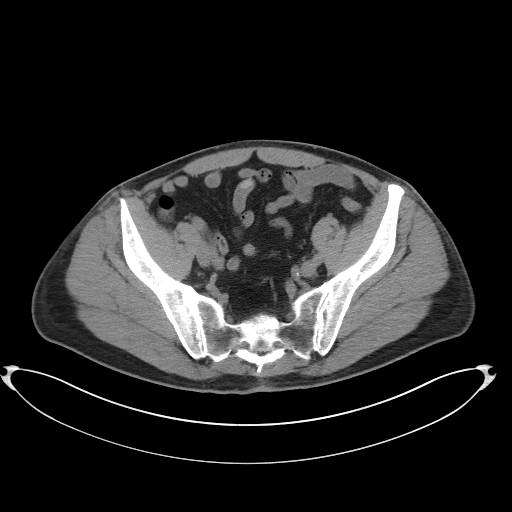
[im 38/90  soft-tissue]
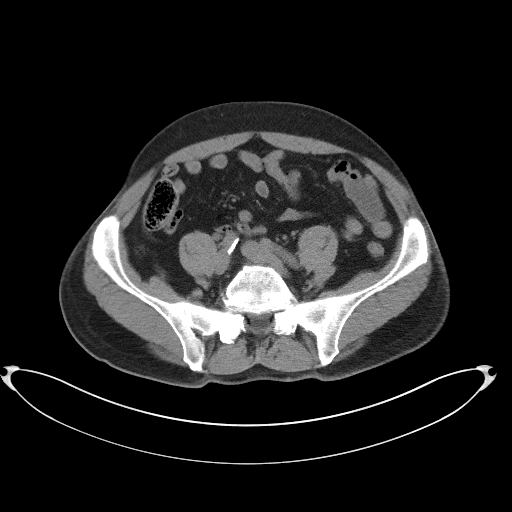
[im 45/90  soft-tissue]
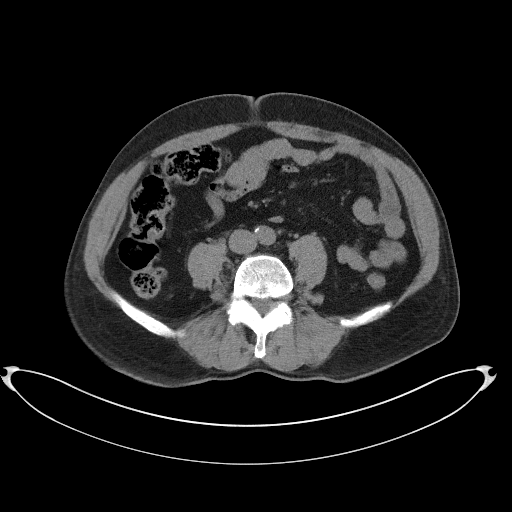
[im 52/90  soft-tissue]
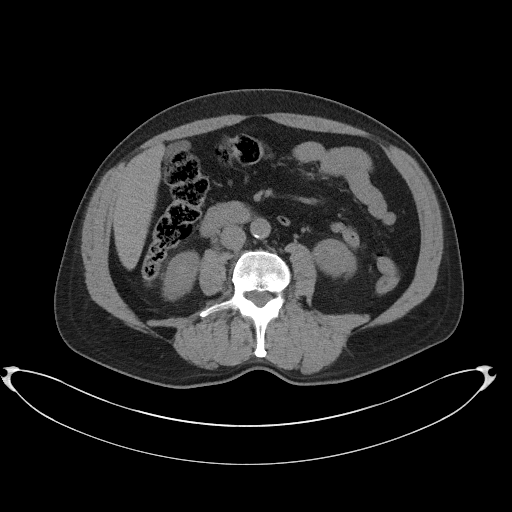
[im 60/90  soft-tissue]
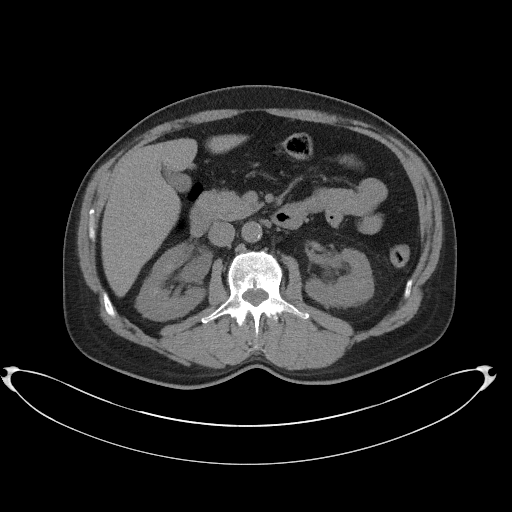
[im 60/90  bone]
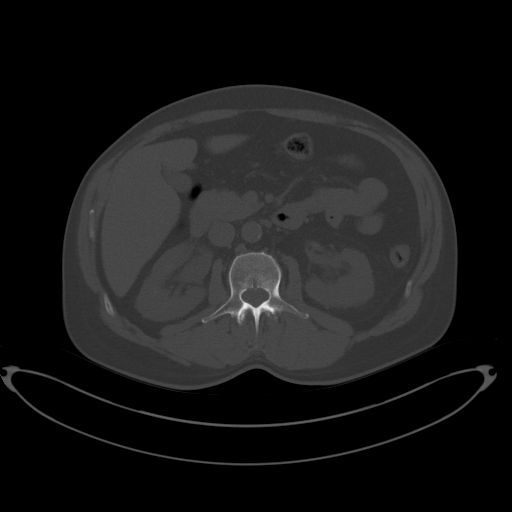
[im 64/90  soft-tissue]
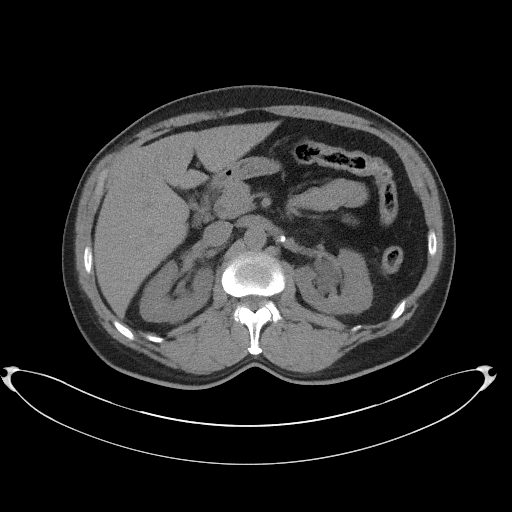
[im 71/90  soft-tissue]
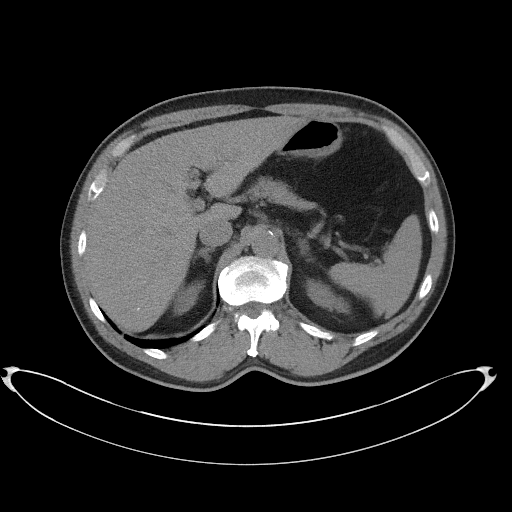
[im 78/90  soft-tissue]
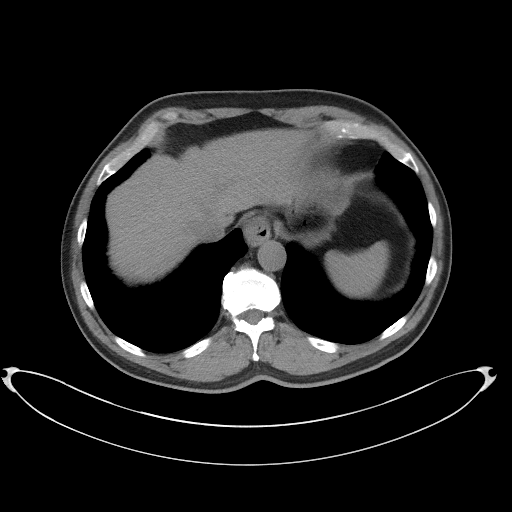
[im 86/90  soft-tissue]
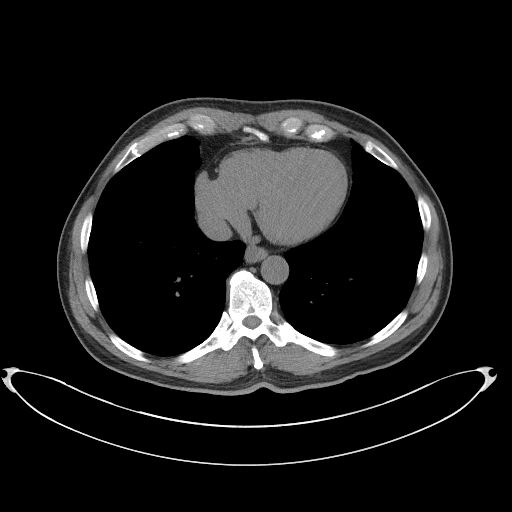

[Series 5: coronal st · coronal · 0.91mm/px · 3 of 92 slices shown]
[im 31/92  soft-tissue]
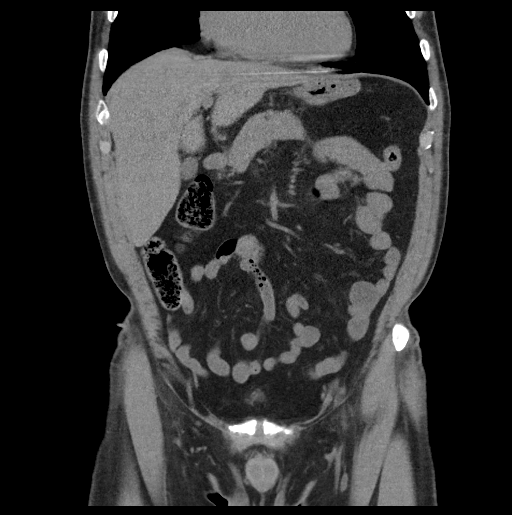
[im 41/92  soft-tissue]
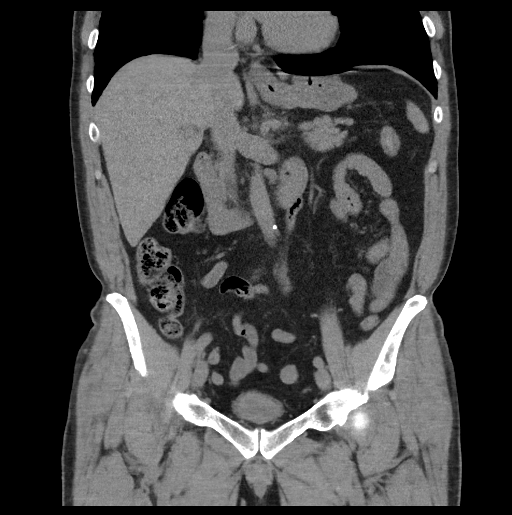
[im 51/92  soft-tissue]
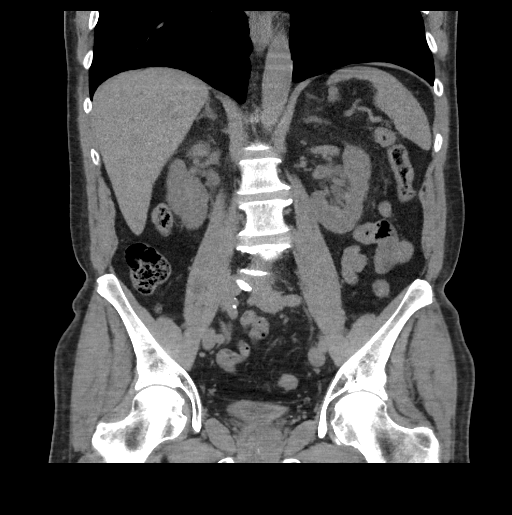

[16 of 46 positions shown; findings below may reference images not displayed]

FINDINGS: Lower chest: Small hiatal hernia.  No other abnormalities.

Hepatobiliary: No focal liver abnormality is seen. No gallstones,
gallbladder wall thickening, or biliary dilatation.

Pancreas: Unremarkable. No pancreatic ductal dilatation or
surrounding inflammatory changes.

Spleen: Normal in size without focal abnormality.

Adrenals/Urinary Tract: Adrenal glands are normal. 2 mm stone in the
upper pole of the right kidney on coronal image 64. Possible subtle
3 or 4 mm stone in the lower pole on coronal image 60. No stones on
the left. New mild hydronephrosis on the right with right
ureterectasis due to a 3 mm stone in the distal right ureter.
Hydronephrosis on the left has decreased in the interval. There may
be some peripelvic cysts. The left ureter is now normal in caliber
in the previously identified stone has passed. Perinephric stranding
seen previously on the left is decreased. The bladder is
decompressed but grossly unremarkable.

Stomach/Bowel: There is a small hiatal hernia. The stomach and small
bowel are otherwise normal. A few scattered colonic diverticuli are
seen without diverticulitis. The appendix is normal.

Vascular/Lymphatic: Calcified atherosclerosis is seen in the
nonaneurysmal aorta. No adenopathy.

Reproductive: Prostate is unremarkable.

Other: No free air or free fluid. There is a small fat containing
periumbilical hernia.

Musculoskeletal: No acute or significant osseous findings.
IMPRESSION: 1. There is a 3 mm stone in the distal right ureter resulting in
mild right hydronephrosis and ureterectasis.
2. There are 1 or 2 stones in the right kidney. No stones seen in
the left kidney.
3. Probable parapelvic cysts on the left. There may also be
hydronephrosis, improved in the interval, from previous obstruction.
No sign for current obstruction on the left.
4. No other acute abnormalities.

## 2021-06-19 HISTORY — PX: OTHER SURGICAL HISTORY: SHX169

## 2021-06-21 ENCOUNTER — Encounter (HOSPITAL_BASED_OUTPATIENT_CLINIC_OR_DEPARTMENT_OTHER): Payer: Self-pay | Admitting: Emergency Medicine

## 2021-06-21 ENCOUNTER — Emergency Department (HOSPITAL_BASED_OUTPATIENT_CLINIC_OR_DEPARTMENT_OTHER)
Admission: EM | Admit: 2021-06-21 | Discharge: 2021-06-21 | Disposition: A | Discharge status: Home / Self Care | Payer: PPO | Attending: Emergency Medicine | Admitting: Emergency Medicine

## 2021-06-21 ENCOUNTER — Other Ambulatory Visit: Payer: Self-pay

## 2021-06-21 DIAGNOSIS — T839XXA Unspecified complication of genitourinary prosthetic device, implant and graft, initial encounter: Secondary | ICD-10-CM

## 2021-06-21 DIAGNOSIS — T83098A Other mechanical complication of other indwelling urethral catheter, initial encounter: Secondary | ICD-10-CM | POA: Diagnosis present

## 2021-06-21 DIAGNOSIS — Y829 Unspecified medical devices associated with adverse incidents: Secondary | ICD-10-CM | POA: Diagnosis not present

## 2021-06-21 HISTORY — DX: Benign prostatic hyperplasia without lower urinary tract symptoms: N40.0

## 2021-06-21 MED ORDER — LIDOCAINE HCL URETHRAL/MUCOSAL 2 % EX GEL
1.0000 | Freq: Once | CUTANEOUS | Status: AC
Start: 2021-06-21 — End: 2021-06-21
  Administered 2021-06-21: 1 via URETHRAL
  Filled 2021-06-21: qty 11

## 2021-06-21 NOTE — Discharge Instructions (Signed)
Continue Foley care.  Follow-up with urologist.

## 2021-06-21 NOTE — ED Notes (Signed)
Bladder scan volume obtained times two 246 and 254ml

## 2021-06-21 NOTE — ED Provider Notes (Signed)
[facility] EMERGENCY DEPARTMENT Provider Note   CSN: [id number] Arrival date & time: 06/21/21  1957     History  Chief Complaint  Patient presents with   Foley Catheter Problem    Jorge Rodriguez is a 71 y.o. male.  Here for Foley catheter issue.  Foley catheter not flowing for the last several hours.  Denies any fevers or chills  The history is provided by the patient.  Illness Severity:  Mild Onset quality:  Gradual Timing:  Constant Progression:  Unchanged Associated symptoms: no abdominal pain, no chest pain, no cough, no ear pain, no fever, no rash, no shortness of breath, no sore throat and no vomiting       Home Medications Prior to Admission medications   Medication Sig Start Date End Date Taking? Authorizing Provider  docusate sodium (COLACE) 100 MG capsule Take 1 capsule (100 mg total) by mouth 2 (two) times daily. Patient taking differently: Take 100 mg by mouth daily. 06/05/20   Mesner, Corene Cornea, MD  hydroxypropyl methylcellulose / hypromellose (ISOPTO TEARS / GONIOVISC) 2.5 % ophthalmic solution Place 1 drop into both eyes daily as needed for dry eyes.    [provider]  naproxen sodium (ALEVE) 220 MG tablet Take 440 mg by mouth daily as needed (pain).    [provider]  oxyCODONE-acetaminophen (PERCOCET) 5-325 MG tablet Take 2 tablets by mouth every 4 (four) hours as needed. Patient taking differently: Take 1 tablet by mouth every 4 (four) hours as needed for severe pain. 06/05/20   Mesner, Corene Cornea, MD  sildenafil (REVATIO) 20 MG tablet Take 20 mg by mouth daily as needed (erectile dysfunction). 03/16/21   [provider]  tamsulosin (FLOMAX) 0.4 MG CAPS capsule Take 1 capsule (0.4 mg total) by mouth daily. Patient taking differently: Take 0.4 mg by mouth daily as needed (took one tablet for kidney stone). 06/05/20   Mesner, Corene Cornea, MD      Allergies    Patient has no known allergies.    Review of Systems   Review of Systems   Constitutional:  Negative for chills and fever.  HENT:  Negative for ear pain and sore throat.   Eyes:  Negative for pain and visual disturbance.  Respiratory:  Negative for cough and shortness of breath.   Cardiovascular:  Negative for chest pain and palpitations.  Gastrointestinal:  Negative for abdominal pain and vomiting.  Genitourinary:  Negative for dysuria and hematuria.  Musculoskeletal:  Negative for arthralgias and back pain.  Skin:  Negative for color change and rash.  Neurological:  Negative for seizures and syncope.  All other systems reviewed and are negative.  Physical Exam Updated Vital Signs BP (!) 158/87    Pulse 83    Temp 98 F (36.7 C) (Oral)    Resp 18    Ht 5\' 10"  (1.778 m)    Wt 83.9 kg    SpO2 96%    BMI 26.54 kg/m  Physical Exam Vitals and nursing note reviewed.  Constitutional:      General: He is not in acute distress.    Appearance: He is well-developed.  HENT:     Head: Normocephalic and atraumatic.  Eyes:     Conjunctiva/sclera: Conjunctivae normal.  Cardiovascular:     Rate and Rhythm: Normal rate and regular rhythm.     Heart sounds: No murmur heard. Pulmonary:     Effort: Pulmonary effort is normal. No respiratory distress.     Breath sounds: Normal breath sounds.  Abdominal:     Palpations: Abdomen is soft.     Tenderness: There is no abdominal tenderness.  Musculoskeletal:        General: No swelling.     Cervical back: Neck supple.  Skin:    General: Skin is warm and dry.     Capillary Refill: Capillary refill takes less than 2 seconds.  Neurological:     Mental Status: He is alert.  Psychiatric:        Mood and Affect: Mood normal.    ED Results / Procedures / Treatments   Labs (all labs ordered are listed, but only abnormal results are displayed) Labs Reviewed - No data to display  EKG None  Radiology No results found.  Procedures Procedures    Medications Ordered in ED Medications  lidocaine (XYLOCAINE) 2 %  jelly 1 application (has no administration in time range)    ED Course/ Medical Decision Making/ A&P                           Medical Decision Making  Jorge Rodriguez is here with Foley catheter problem.  Has chronic Foley from enlarged prostate.  Normal vitals.  No fever.  Clogged for the last several hours.  Foley catheter was exchanged without any issues.  No concern for infectious process.  Discharged in good condition.  Understands return precautions.  We will have him follow-up with urology.        Final Clinical Impression(s) / ED Diagnoses Final diagnoses:  Problem with Foley catheter, initial encounter [facility])    Rx / [place] Orders ED Discharge Orders     None         Lennice Sites, DO 06/21/21 2152

## 2021-06-21 NOTE — ED Triage Notes (Signed)
Pt reports foley cath not draining like normal today; it was replaced 1 wk ago; has had a catheter in since 11/18

## 2021-06-21 NOTE — ED Notes (Signed)
Pt provided discharge instructions and prescription information. Pt was given the opportunity to ask questions and questions were answered. Discharge signature not obtained in the setting of the COVID-19 pandemic in order to reduce high touch surfaces.  ° °

## 2021-07-05 ENCOUNTER — Other Ambulatory Visit: Payer: Self-pay | Admitting: Urology

## 2021-07-05 DIAGNOSIS — R972 Elevated prostate specific antigen [PSA]: Secondary | ICD-10-CM

## 2023-02-07 ENCOUNTER — Other Ambulatory Visit (HOSPITAL_COMMUNITY): Payer: Self-pay | Admitting: Urology

## 2023-02-07 DIAGNOSIS — C61 Malignant neoplasm of prostate: Secondary | ICD-10-CM

## 2023-02-14 ENCOUNTER — Encounter (HOSPITAL_COMMUNITY)
Admission: RE | Admit: 2023-02-14 | Discharge: 2023-02-14 | Disposition: A | Discharge status: Home / Self Care | Payer: PPO | Source: Ambulatory Visit | Attending: Urology | Admitting: Urology

## 2023-02-14 DIAGNOSIS — C61 Malignant neoplasm of prostate: Secondary | ICD-10-CM | POA: Insufficient documentation

## 2023-02-14 MED ORDER — FLOTUFOLASTAT F 18 GALLIUM 296-5846 MBQ/ML IV SOLN
8.5300 | Freq: Once | INTRAVENOUS | Status: AC
  Administered 2023-02-14: 8.53 via INTRAVENOUS

## 2023-02-14 NOTE — Progress Notes (Signed)
GU Location of Tumor / Histology: Prostate Ca  If Prostate Cancer, Gleason Score is (3 + 4) and PSA is (8.08 on 10/20/2022)  Biopsies       Past/Anticipated interventions by urology, if any:   Past/Anticipated interventions by medical oncology, if any: NA  Weight changes, if any: No  IPSS: 3 SHIM: 5  Bowel/Bladder complaints, if any: No  Nausea/Vomiting, if any: No  Pain issues, if any: No   SAFETY ISSUES: Prior radiation? No  Pacemaker/ICD? No  Possible current pregnancy? Male Is the patient on methotrexate? No  Current Complaints / other details:  No concerns at this time.

## 2023-02-19 NOTE — Progress Notes (Signed)
Radiation Oncology         [phone] ________________________________  Initial Outpatient Consultation  Name: Jorge Rodriguez MRN: [id number]  Date: 02/20/2023  DOB: 28-Aug-1950  WU:[id number], Janifer Adie, PA-C  Jorge Field, MD   REFERRING PHYSICIAN: [name], MD  DIAGNOSIS: 72 y.o. gentleman with Stage T2b adenocarcinoma of the prostate with Gleason score of 4+4, and PSA of 8.1.  No diagnosis found.  HISTORY OF PRESENT ILLNESS: Jorge Rodriguez is a 72 y.o. male with a diagnosis of prostate cancer. He has been followed by urology for a history of BPH with urinary retention in 04/2021. He underwent UroLift procedure in 08/2021. He also has a history of an elevated PSA of 6.82 in 05/2021, which was attributed to his urinary retention. However, subsequent PSA levels remained elevated at 5.11 in 08/2021 and most recently 8.08 in 10/2022. Digital rectal examination was performed at follow up on 10/19/22 showed firm rigidity along edges of both lateral lobes, with no frank nodularity appreciated.  The patient proceeded to transrectal ultrasound with 12 biopsies of the prostate on 01/15/23. Repeat rectal exam performed at that time revealed a 10 mm right base prostate nodule. The prostate volume measured 34.92 cc.  Out of 12 core biopsies, 4 were positive.  The maximum Gleason score was 4+4, and this was seen in right base. Additionally, Gleason 3+4 was seen in left mid (two small foci) and Gleason 3+3 in left mid lateral and left apex.  He underwent staging PSMA PET scan on 02/14/23 showing: two foci of intense radiotracer activity in prostate gland; no evidence of nodal, visceral, or skeletal metastasis.  The patient reviewed the biopsy results with his urologist and he has kindly been referred today for discussion of potential radiation treatment options.   PREVIOUS RADIATION THERAPY: No  PAST MEDICAL HISTORY:  Past Medical History:  Diagnosis Date   Arthritis    Enlarged prostate    History  of kidney stones       PAST SURGICAL HISTORY: Past Surgical History:  Procedure Laterality Date   TENDON REPAIR Left 01/31/2014   Procedure: TENDON REPAIR;  Surgeon: Sharma Covert, MD;  Location: [facility] OR;  Service: Orthopedics;  Laterality: Left;  wants to follow   WISDOM TOOTH EXTRACTION      FAMILY HISTORY:  Family History  Problem Relation Age of Onset   Diabetes Father    Diabetes Sister     SOCIAL HISTORY:  Social History   Socioeconomic History   Marital status: Married    Spouse name: Not on file   Number of children: Not on file   Years of education: Not on file   Highest education level: Not on file  Occupational History   Not on file  Tobacco Use   Smoking status: Never   Smokeless tobacco: Never  Substance and Sexual Activity   Alcohol use: No    Comment: rare   Drug use: No   Sexual activity: Not on file  Other Topics Concern   Not on file  Social History Narrative   Not on file   Social Determinants of Health   Financial Resource Strain: Not on file  Food Insecurity: Not on file  Transportation Needs: Not on file  Physical Activity: Not on file  Stress: Not on file  Social Connections: Not on file  Intimate Partner Violence: Not on file    ALLERGIES: Patient has no known allergies.  MEDICATIONS:  Current Outpatient Medications  Medication Sig Dispense Refill  docusate sodium (COLACE) 100 MG capsule Take 1 capsule (100 mg total) by mouth 2 (two) times daily. (Patient taking differently: Take 100 mg by mouth daily.) 10 capsule 0   hydroxypropyl methylcellulose / hypromellose (ISOPTO TEARS / GONIOVISC) 2.5 % ophthalmic solution Place 1 drop into both eyes daily as needed for dry eyes.     naproxen sodium (ALEVE) 220 MG tablet Take 440 mg by mouth daily as needed (pain).     oxyCODONE-acetaminophen (PERCOCET) 5-325 MG tablet Take 2 tablets by mouth every 4 (four) hours as needed. (Patient taking differently: Take 1 tablet by mouth every 4 (four)  hours as needed for severe pain.) 20 tablet 0   sildenafil (REVATIO) 20 MG tablet Take 20 mg by mouth daily as needed (erectile dysfunction).     tamsulosin (FLOMAX) 0.4 MG CAPS capsule Take 1 capsule (0.4 mg total) by mouth daily. (Patient taking differently: Take 0.4 mg by mouth daily as needed (took one tablet for kidney stone).) 30 capsule    No current facility-administered medications for this encounter.    REVIEW OF SYSTEMS:  On review of systems, the patient reports that he is doing well overall. He denies any chest pain, shortness of breath, cough, fevers, chills, night sweats, unintended weight changes. He denies any bowel disturbances, and denies abdominal pain, nausea or vomiting. He denies any new musculoskeletal or joint aches or pains. His IPSS was ***, indicating *** urinary symptoms. His SHIM was ***, indicating he {does not have/has mild/moderate/severe} erectile dysfunction. A complete review of systems is obtained and is otherwise negative.    PHYSICAL EXAM:  Wt Readings from Last 3 Encounters:  06/21/21 185 lb (83.9 kg)  06/08/21 182 lb (82.6 kg)  05/06/21 180 lb (81.6 kg)   Temp Readings from Last 3 Encounters:  06/21/21 98 F (36.7 C) (Oral)  06/08/21 98.4 F (36.9 C) (Oral)  05/07/21 97.9 F (36.6 C) (Oral)   BP Readings from Last 3 Encounters:  06/21/21 (!) 158/86  06/08/21 (!) 154/90  05/07/21 (!) 157/83   Pulse Readings from Last 3 Encounters:  06/21/21 70  06/08/21 71  05/07/21 71    /10  In general this is a well appearing *** male in no acute distress. He's alert and oriented x4 and appropriate throughout the examination. Cardiopulmonary assessment is negative for acute distress, and he exhibits normal effort.     KPS = ***  100 - Normal; no complaints; no evidence of disease. 90   - Able to carry on normal activity; minor signs or symptoms of disease. 80   - Normal activity with effort; some signs or symptoms of disease. 65   - Cares for  self; unable to carry on normal activity or to do active work. 60   - Requires occasional assistance, but is able to care for most of his personal needs. 50   - Requires considerable assistance and frequent medical care. 40   - Disabled; requires special care and assistance. 30   - Severely disabled; hospital admission is indicated although death not imminent. 20   - Very sick; hospital admission necessary; active supportive treatment necessary. 10   - Moribund; fatal processes progressing rapidly. 0     - Dead  Karnofsky DA, Abelmann WH, Craver LS and Burchenal [facility] [phone]) The use of the nitrogen mustards in the palliative treatment of carcinoma: with particular reference to bronchogenic carcinoma Cancer 1 634-56  LABORATORY DATA:  Lab Results  Component Value Date   WBC 6.4  05/07/2021   HGB 11.7 (L) 05/07/2021   HCT 34.5 (L) 05/07/2021   MCV 94.0 05/07/2021   PLT 209 05/07/2021   Lab Results  Component Value Date   NA 138 05/07/2021   K 4.1 05/07/2021   CL 107 05/07/2021   CO2 24 05/07/2021   Lab Results  Component Value Date   ALT 18 06/05/2020   AST 21 06/05/2020   ALKPHOS 40 06/05/2020   BILITOT 1.0 06/05/2020     RADIOGRAPHY: NM PET (PSMA) SKULL TO MID THIGH  Result Date: 02/14/2023 CLINICAL DATA:  Prostate carcinoma.  PSA equal 8.1. EXAM: NUCLEAR MEDICINE PET SKULL BASE TO THIGH TECHNIQUE: 8.5 mCi Flotufolastat (Posluma) was injected intravenously. Full-ring PET imaging was performed from the skull base to thigh after the radiotracer. CT data was obtained and used for attenuation correction and anatomic localization. COMPARISON:  None Available. FINDINGS: NECK No radiotracer activity in neck lymph nodes. Incidental CT finding: None. CHEST No radiotracer accumulation within mediastinal or hilar lymph nodes. No suspicious pulmonary nodules on the CT scan. Incidental CT finding: Coronary artery calcification and aortic atherosclerotic calcification. ABDOMEN/PELVIS Prostate:  Intense focal radiotracer activity in the posterior RIGHT aspect of the prostate gland with SUV max equal 27. Estimated lesion size = 1 cm. Smaller intense radiotracer activity in the lesion within the posterior LEFT aspect prostate gland same image (image 196) Uro lift device noted centrally within the gland. Lymph nodes: No abnormal radiotracer accumulation within pelvic or abdominal nodes. Liver: No evidence of liver metastasis. Incidental CT finding: None. SKELETON No focal activity to suggest skeletal metastasis. IMPRESSION: 1. Two foci intense radiotracer activity the prostate gland consistent with primary prostate adenocarcinoma. 2. No evidence of prostate cancer nodal metastasis in the pelvis or periaortic retroperitoneum. 3. No visceral metastasis or skeletal metastasis Electronically Signed   By: Genevive Bi M.D.   On: 02/14/2023 16:05      IMPRESSION/PLAN: 1. 72 y.o. gentleman with Stage T2b adenocarcinoma of the prostate with Gleason Score of 4+4, and PSA of 8.1. We discussed the patient's workup and outlined the nature of prostate cancer in this setting. The patient's T stage, Gleason's score, and PSA put him into the high risk group. Accordingly, he is eligible for a variety of potential treatment options including LT-ADT concurrent with 8 weeks of external radiation, 5 weeks of external radiation with an upfront brachytherapy boost, or prostatectomy. We discussed the available radiation techniques, and focused on the details and logistics of delivery. The patient may not be an ideal candidate for brachytherapy boost given his history of urinary retention.*** We discussed and outlined the risks, benefits, short and long-term effects associated with radiotherapy and compared and contrasted these with prostatectomy. We discussed the role of SpaceOAR gel in reducing the rectal toxicity associated with radiotherapy. We also detailed the role of ADT in the treatment of high risk prostate cancer and  outlined the associated side effects that could be expected with this therapy. He appears to have a good understanding of his disease and our treatment recommendations which are of curative intent.  He was encouraged to ask questions that were answered to his stated satisfaction.  At the conclusion of our conversation, the patient is interested in moving forward with ***.  We personally spent *** minutes in this encounter including chart review, reviewing radiological studies, meeting face-to-face with the patient, entering orders and completing documentation.    Marguarite Arbour, PA-C    Margaretmary Dys, MD  [facility]  Radiation  Oncology Direct Dial: [phone]  Fax: [phone] [email or web address]  Skype  LinkedIn   This document serves as a record of services personally performed by Margaretmary Dys, MD and Marcello Fennel, PA-C. It was created on their behalf by Mickie Bail, a trained medical scribe. The creation of this record is based on the scribe's personal observations and the provider's statements to them. This document has been checked and approved by the attending provider.

## 2023-02-20 ENCOUNTER — Encounter: Payer: Self-pay | Admitting: Radiation Oncology

## 2023-02-20 ENCOUNTER — Ambulatory Visit
Admission: RE | Admit: 2023-02-20 | Discharge: 2023-02-20 | Disposition: A | Discharge status: Home / Self Care | Payer: PPO | Source: Ambulatory Visit | Attending: Radiation Oncology | Admitting: Radiation Oncology

## 2023-02-20 VITALS — BP 139/89 | HR 69 | Temp 97.7°F | Resp 18 | Ht 70.0 in | Wt 186.4 lb

## 2023-02-20 DIAGNOSIS — Z79899 Other long term (current) drug therapy: Secondary | ICD-10-CM | POA: Diagnosis not present

## 2023-02-20 DIAGNOSIS — C61 Malignant neoplasm of prostate: Secondary | ICD-10-CM | POA: Insufficient documentation

## 2023-02-20 NOTE — Progress Notes (Signed)
Introduced myself to the patient, and his wife, as the prostate nurse navigator.  No barriers to care identified at this time.  He is here to discuss his radiation treatment options and will proceed with LT-ADT followed by daily radiation.  I gave him my contact information and will continue to follow to ensure navigation needs are met.

## 2023-02-23 NOTE — Progress Notes (Signed)
RN spoke with staff at [facility] to check status of authorization for ADT.  Triage nurse will review and contact patient once authorized.  RN will continue to follow.

## 2023-02-27 ENCOUNTER — Other Ambulatory Visit: Payer: Self-pay | Admitting: Urology

## 2023-02-27 NOTE — Progress Notes (Signed)
RN spoke with patient and confirmed he was able to be seen at [facility] today to receive Eligard 45mg .    Plan of care in progress.

## 2023-02-28 ENCOUNTER — Telehealth: Payer: Self-pay | Admitting: *Deleted

## 2023-02-28 NOTE — Telephone Encounter (Signed)
Called patient to inform of appt. for fid. markers and space oar gel placement on 04-13-23 and his sim on 04-17-23- arrival time- 1:45 pm @ [facility], informed patient to arrive with a full bladder, spoke with patient and he is aware of these appts. and the instructions

## 2023-04-04 ENCOUNTER — Other Ambulatory Visit: Payer: Self-pay

## 2023-04-04 ENCOUNTER — Encounter (HOSPITAL_BASED_OUTPATIENT_CLINIC_OR_DEPARTMENT_OTHER): Payer: Self-pay | Admitting: Urology

## 2023-04-04 NOTE — Progress Notes (Addendum)
Spoke w/ via phone for pre-op interview---pt Lab needs dos----none         Lab results------ COVID test -----patient states asymptomatic no test needed Arrive at -------600 am 04-13-2023 NPO after MN NO Solid Food.  Clear liquids from MN until---500 Med rec completed Medications to take morning of surgery -----none Diabetic medication -----n/a Patient instructed no nail polish to be worn day of surgery Patient instructed to bring photo id and insurance card day of surgery Patient aware to have Driver (ride ) / caregiver    for 24 hours after surgery  wife Olegario Messier-  Patient Special Instructions -----fleets enema at bedtime night before surgery Pre-Op special Instructions -----none Patient verbalized understanding of instructions that were given at this phone interview. Patient denies chest pain, sob, fever, cough at the interview.

## 2023-04-06 NOTE — Progress Notes (Signed)
RN spoke with patient to assess any questions or barriers prior to upcoming radiation.

## 2023-04-13 ENCOUNTER — Encounter (HOSPITAL_BASED_OUTPATIENT_CLINIC_OR_DEPARTMENT_OTHER): Payer: Self-pay | Admitting: Urology

## 2023-04-13 ENCOUNTER — Ambulatory Visit (HOSPITAL_BASED_OUTPATIENT_CLINIC_OR_DEPARTMENT_OTHER)
Admission: RE | Admit: 2023-04-13 | Discharge: 2023-04-13 | Disposition: A | Discharge status: Home / Self Care | Payer: PPO | Attending: Urology | Admitting: Urology

## 2023-04-13 ENCOUNTER — Encounter (HOSPITAL_BASED_OUTPATIENT_CLINIC_OR_DEPARTMENT_OTHER)
Admission: RE | Disposition: A | Discharge status: Home / Self Care | Payer: Self-pay | Source: Home / Self Care | Attending: Urology

## 2023-04-13 ENCOUNTER — Other Ambulatory Visit: Payer: Self-pay

## 2023-04-13 ENCOUNTER — Ambulatory Visit (HOSPITAL_BASED_OUTPATIENT_CLINIC_OR_DEPARTMENT_OTHER): Payer: PPO | Admitting: Certified Registered Nurse Anesthetist

## 2023-04-13 DIAGNOSIS — K219 Gastro-esophageal reflux disease without esophagitis: Secondary | ICD-10-CM | POA: Insufficient documentation

## 2023-04-13 DIAGNOSIS — Z87442 Personal history of urinary calculi: Secondary | ICD-10-CM | POA: Diagnosis not present

## 2023-04-13 DIAGNOSIS — C61 Malignant neoplasm of prostate: Secondary | ICD-10-CM | POA: Diagnosis present

## 2023-04-13 DIAGNOSIS — Z01818 Encounter for other preprocedural examination: Secondary | ICD-10-CM

## 2023-04-13 DIAGNOSIS — M1990 Unspecified osteoarthritis, unspecified site: Secondary | ICD-10-CM | POA: Insufficient documentation

## 2023-04-13 DIAGNOSIS — N402 Nodular prostate without lower urinary tract symptoms: Secondary | ICD-10-CM | POA: Diagnosis not present

## 2023-04-13 HISTORY — PX: SPACE OAR INSTILLATION: SHX6769

## 2023-04-13 HISTORY — DX: Gastro-esophageal reflux disease without esophagitis: K21.9

## 2023-04-13 HISTORY — DX: Malignant neoplasm of prostate: C61

## 2023-04-13 HISTORY — DX: Presence of spectacles and contact lenses: Z97.3

## 2023-04-13 HISTORY — PX: GOLD SEED IMPLANT: SHX6343

## 2023-04-13 SURGERY — INSERTION, GOLD SEEDS
Anesthesia: Monitor Anesthesia Care | Site: Prostate

## 2023-04-13 MED ORDER — PROPOFOL 500 MG/50ML IV EMUL
INTRAVENOUS | Status: DC | PRN
  Administered 2023-04-13: 180 ug/kg/min via INTRAVENOUS

## 2023-04-13 MED ORDER — FENTANYL CITRATE (PF) 100 MCG/2ML IJ SOLN: 25.0000 ug | INTRAMUSCULAR | Status: DC | PRN

## 2023-04-13 MED ORDER — FLEET ENEMA RE ENEM: 1.0000 | ENEMA | Freq: Once | RECTAL | Status: DC

## 2023-04-13 MED ORDER — SODIUM CHLORIDE 0.9 % IV SOLN: INTRAVENOUS | Status: DC | PRN

## 2023-04-13 MED ORDER — FENTANYL CITRATE (PF) 250 MCG/5ML IJ SOLN
INTRAMUSCULAR | Status: DC | PRN
  Administered 2023-04-13: 50 ug via INTRAVENOUS

## 2023-04-13 MED ORDER — CEFAZOLIN SODIUM-DEXTROSE 2-4 GM/100ML-% IV SOLN
2.0000 g | INTRAVENOUS | Status: AC
  Administered 2023-04-13: 2 g via INTRAVENOUS

## 2023-04-13 MED ORDER — LIDOCAINE 2% (20 MG/ML) 5 ML SYRINGE
INTRAMUSCULAR | Status: DC | PRN
  Administered 2023-04-13: 40 mg via INTRAVENOUS

## 2023-04-13 MED ORDER — SODIUM CHLORIDE (PF) 0.9 % IJ SOLN
INTRAMUSCULAR | Status: DC | PRN
  Administered 2023-04-13: 10 mL

## 2023-04-13 MED ORDER — OXYCODONE HCL 5 MG/5ML PO SOLN: 5.0000 mg | Freq: Once | ORAL | Status: DC | PRN

## 2023-04-13 MED ORDER — CEFAZOLIN SODIUM-DEXTROSE 2-4 GM/100ML-% IV SOLN
INTRAVENOUS | Status: AC
  Filled 2023-04-13: qty 100

## 2023-04-13 MED ORDER — ONDANSETRON HCL 4 MG/2ML IJ SOLN: 4.0000 mg | Freq: Once | INTRAMUSCULAR | Status: DC | PRN

## 2023-04-13 MED ORDER — FENTANYL CITRATE (PF) 100 MCG/2ML IJ SOLN
INTRAMUSCULAR | Status: AC
  Filled 2023-04-13: qty 2

## 2023-04-13 MED ORDER — PROPOFOL 10 MG/ML IV BOLUS
INTRAVENOUS | Status: DC | PRN
  Administered 2023-04-13 (×2): 20 mg via INTRAVENOUS

## 2023-04-13 MED ORDER — ONDANSETRON HCL 4 MG/2ML IJ SOLN
INTRAMUSCULAR | Status: DC | PRN
  Administered 2023-04-13: 4 mg via INTRAVENOUS

## 2023-04-13 MED ORDER — LACTATED RINGERS IV SOLN: INTRAVENOUS | Status: DC

## 2023-04-13 MED ORDER — LIDOCAINE HCL (PF) 1 % IJ SOLN
INTRAMUSCULAR | Status: DC | PRN
  Administered 2023-04-13: 10 mL

## 2023-04-13 MED ORDER — OXYCODONE HCL 5 MG PO TABS: 5.0000 mg | ORAL_TABLET | Freq: Once | ORAL | Status: DC | PRN

## 2023-04-13 SURGICAL SUPPLY — 26 items
BLADE CLIPPER SENSICLIP SURGIC (BLADE) ×1 IMPLANT
CNTNR URN SCR LID CUP LEK RST (MISCELLANEOUS) ×1 IMPLANT
CONT SPEC 4OZ STRL OR WHT (MISCELLANEOUS) ×1
COVER BACK TABLE 60X90IN (DRAPES) ×1 IMPLANT
DRSG TEGADERM 4X4.75 (GAUZE/BANDAGES/DRESSINGS) ×1 IMPLANT
DRSG TEGADERM 8X12 (GAUZE/BANDAGES/DRESSINGS) ×1 IMPLANT
GAUZE SPONGE 4X4 12PLY STRL (GAUZE/BANDAGES/DRESSINGS) ×1 IMPLANT
GLOVE BIO SURGEON STRL SZ7.5 (GLOVE) ×1 IMPLANT
GLOVE BIO SURGEON STRL SZ8 (GLOVE) IMPLANT
GLOVE SURG ORTHO 8.5 STRL (GLOVE) ×1 IMPLANT
IMPL SPACEOAR VUE SYSTEM (Spacer) ×1 IMPLANT
IMPLANT SPACEOAR VUE SYSTEM (Spacer) ×1 IMPLANT
KIT TURNOVER CYSTO (KITS) ×1 IMPLANT
MARKER GOLD PRELOAD 1.2X3 (Urological Implant) ×1 IMPLANT
MARKER SKIN DUAL TIP RULER LAB (MISCELLANEOUS) ×1 IMPLANT
NDL SPNL 22GX3.5 QUINCKE BK (NEEDLE) ×1 IMPLANT
NEEDLE SPNL 22GX3.5 QUINCKE BK (NEEDLE) ×1
SEED GOLD PRELOAD 1.2X3 (Urological Implant) ×1 IMPLANT
SHEATH ULTRASOUND LF (SHEATH) IMPLANT
SHEATH ULTRASOUND LTX NONSTRL (SHEATH) IMPLANT
SLEEVE SCD COMPRESS KNEE MED (STOCKING) ×1 IMPLANT
SURGILUBE 2OZ TUBE FLIPTOP (MISCELLANEOUS) ×1 IMPLANT
SYR 10ML LL (SYRINGE) IMPLANT
SYR CONTROL 10ML LL (SYRINGE) ×1 IMPLANT
TOWEL OR 17X24 6PK STRL BLUE (TOWEL DISPOSABLE) ×1 IMPLANT
UNDERPAD 30X36 HEAVY ABSORB (UNDERPADS AND DIAPERS) ×1 IMPLANT

## 2023-04-13 NOTE — Op Note (Signed)
Preoperative diagnosis: Prostate cancer Postoperative diagnosis: Prostate cancer  Procedure: Transrectal ultrasound-guided transperineal placement of gold seed prostate fiducial markers and SpaceOAR biodegradable gel  Surgeon: Mena Goes  Anesthesia: General  Indication for procedure: 72 yo man who is preparing to start external beam radiation.  He presents today for the above.  Findings: Normal-appearing prostate with intact capsule and no hypoechoic. Seminal vesicles appeared normal.  Description of procedure: After consent was obtained patient brought to the operating room.  After adequate anesthesia he was placed in lithotomy position and the scrotum supported superiorly.  The perineum was prepped.  Ultrasound probe inserted rectally.  Prostate imaged in the axial and sagittal views.  The gold seed markers were passed transperineally under ultrasound guidance placing three total with one in the right base, right apex and left mid of the prostate gland.  The 18-gauge needle was then inserted approximately 1 to 2 cm anterior to the anal opening and directed under ultrasonic guidance into the perirectal fat between the anterior rectal wall and the prostate capsule down to the mid-gland. Midline needle position was confirmed in the sagittal and axial views to verify the tip was in the perirectal fat.  Small amounts of saline were injected to hydrodissect the space between the prostate and the anterior rectal wall.  Axial imaging was viewed to confirm the needle was in the correct location in the mid gland and centered.  Aspiration confirmed no intravascular access.  The saline syringe was carefully disconnected maintaining the desired needle position and the hydrogel was attached to the needle.  Under ultrasound guidance in the sagittal view a smooth continuous injection was done over about 12 seconds delivering the hydrogel into the space between the prostate and rectal wall.  The needle was  withdrawn. Post-procedure DRE noted no rectal injury and no blood on gloved finger.   A dressing was placed and the patient was awakened and taken to the cover room in stable condition.  Complications: None  Blood loss: Minimal  Specimens: None  Drains: None  Disposition: Patient stable to PACU

## 2023-04-13 NOTE — Discharge Instructions (Addendum)
Transrectal Ultrasound-Guided Prostate Gold Seed and Biodegradable Gel Placement, Care After  The following information offers guidance on how to care for yourself after your procedure. Your health care provider may also give you more specific instructions. If you have problems or questions, contact your health care provider. What can I expect after the procedure? After the procedure, it is common to have: Light bleeding from the rectum. Bruising or tenderness in the area behind the scrotum (perineum), if the needle was put into your prostate through this area. Small amounts of blood in your urine. This should only last for a few days. Light brown or red semen. This may last for a couple of weeks. Follow these instructions at home: Medicines A prescription pill bottle with an example of a pill.  Take over-the-counter and prescription medicines only as told by your health care provider. If you were prescribed an antibiotic, take it as told by your health care provider. Do not stop taking the antibiotic early, even if you start to feel better. Ask your health care provider if the medicine prescribed to you requires you to avoid driving or using machinery. Eating and drinking Follow instructions from your health care provider about eating or drinking restrictions. Drink enough fluid to keep your urine pale yellow. Managing pain and swelling If directed, put ice on the affected area. To do this: Put ice in a plastic bag. Place a towel between your skin and the bag. Leave the ice on for 20 minutes, 2-3 times a day. Be sure to remove the ice if your skin turns bright red. If you cannot feel pain, heat, or cold, you have a greater risk of damage to the area. Try not to sit directly on the area behind the scrotum. A soft cushion can help with discomfort. Activity If you were given a sedative during the procedure, it can affect you for several hours. Do not drive or operate machinery until your  health care provider says that it is safe. Return to your normal activities as told by your health care provider. Ask your health care provider what activities are safe for you. Follow instructions from your health care provider about when it is safe for you to engage in sexual activity. General instructions Plan to have a responsible adult care for you for the time you are told after you leave the hospital or clinic. Do not take baths, swim, or use a hot tub until your health care provider approves. Ask your health care provider if you may take showers. You may only be allowed to take sponge baths. Keep all follow-up visits. Contact a health care provider if: You have a fever or chills. You have more blood in your urine. You have blood in your urine for more than 2-3 days after the procedure. You have trouble passing urine or having a bowel movement. You have pain or burning when urinating. You have nausea or you vomit. Get help right away if: You have severe pain that does not get better with medicine. Your urine is bright red. You cannot urinate. You have rectal bleeding that gets worse. You have shortness of breath. Summary After the procedure, you may have blood in your urine and light bleeding from the rectum. Return to your normal activities as told by your health care provider. Ask your health care provider what activities are safe for you. Take over-the-counter and prescription medicines only as told by your health care provider. Contact your health care provider right away if   your urine is bright red or you cannot pass urine. This information is not intended to replace advice given to you by your health care provider. Make sure you discuss any questions you have with your health care provider. Document Revised: 09/01/2020 Document Reviewed: 09/01/2020 Elsevier Patient Education  2023 [employer] Inc.        Post Anesthesia Home Care Instructions  Activity: Get plenty of  rest for the remainder of the day. A responsible individual must stay with you for 24 hours following the procedure.  For the next 24 hours, DO NOT: -Drive a car -Operate machinery -Drink alcoholic beverages -Take any medication unless instructed by your physician -Make any legal decisions or sign important papers.  Meals: Start with liquid foods such as gelatin or soup. Progress to regular foods as tolerated. Avoid greasy, spicy, heavy foods. If nausea and/or vomiting occur, drink only clear liquids until the nausea and/or vomiting subsides. Call your physician if vomiting continues.  Special Instructions/Symptoms: Your throat may feel dry or sore from the anesthesia or the breathing tube placed in your throat during surgery. If this causes discomfort, gargle with warm salt water. The discomfort should disappear within 24 hours.     

## 2023-04-13 NOTE — Anesthesia Postprocedure Evaluation (Signed)
Anesthesia Post Note  Patient: Jorge Rodriguez  Procedure(s) Performed: GOLD SEED IMPLANT (Prostate) SPACE OAR INSTILLATION (Prostate)     Patient location during evaluation: PACU Anesthesia Type: MAC Level of consciousness: awake and alert and oriented Pain management: pain level controlled Vital Signs Assessment: post-procedure vital signs reviewed and stable Respiratory status: spontaneous breathing, nonlabored ventilation and respiratory function stable Cardiovascular status: stable and blood pressure returned to baseline Postop Assessment: no apparent nausea or vomiting Anesthetic complications: no   No notable events documented.  Last Vitals:  Vitals:   04/13/23 0643 04/13/23 0907  BP: (!) 156/80 120/74  Pulse: 66 70  Resp: 16 14  Temp: (!) 36.4 C 36.6 C  SpO2: 97% 94%    Last Pain:  Vitals:   04/13/23 0907  TempSrc:   PainSc: 0-No pain                 [name]

## 2023-04-13 NOTE — H&P (Signed)
H&P  Chief Complaint: Prostate cancer  History of Present Illness: Jorge Rodriguez is a 72 year old male with a diagnosis of high risk prostate cancer from an August 2024 biopsy.  His PSA was 8 with a right prostate nodule and grade group 4 disease.  His prostate was 35 g.  He does have a history of prostatic urethral lift.  He started [facility] February 27, 2023.  He presents today for gold seed prostate fiducial markers and SpaceOAR biodegradable gel insertion.  He has been well without dysuria or gross hematuria.  No voiding complaints.  No cough cold or congestion.  Past Medical History:  Diagnosis Date   Arthritis    Enlarged prostate    GERD (gastroesophageal reflux disease)    History of kidney stones    Prostate cancer (HCC)    Wears glasses    Past Surgical History:  Procedure Laterality Date   PROSTATE BIOPSY     TENDON REPAIR Left 01/31/2014   Procedure: TENDON REPAIR;  Surgeon: Sharma Covert, MD;  Location: [facility] OR;  Service: Orthopedics;  Laterality: Left;  wants to follow   urolift for prostate  2023   WISDOM TOOTH EXTRACTION      Home Medications:  Medications Prior to Admission  Medication Sig Dispense Refill Last Dose   docusate sodium (COLACE) 100 MG capsule Take 1 capsule (100 mg total) by mouth 2 (two) times daily. (Patient taking differently: Take 100 mg by mouth daily as needed.) 10 capsule 0 04/12/2023   hydroxypropyl methylcellulose / hypromellose (ISOPTO TEARS / GONIOVISC) 2.5 % ophthalmic solution Place 1 drop into both eyes daily as needed for dry eyes.   04/13/2023 at 0530   Melatonin-Pyridoxine (MELATIN PO) Take 2 mg by mouth at bedtime.   04/12/2023   naproxen sodium (ALEVE) 220 MG tablet Take 440 mg by mouth daily as needed (pain).   Past Week   sildenafil (REVATIO) 20 MG tablet Take 20 mg by mouth daily as needed (erectile dysfunction).   Past Month   Blood Pressure Monitoring (BLOOD PRESSURE KIT) KIT 1 each by miscellaneous route daily. For high BP.       leuprolide, 6 Month, (ELIGARD) 45 MG injection Inject 45 mg into the skin every 6 (six) months.   More than a month   omeprazole (PRILOSEC) 20 MG capsule Take 20 mg by mouth as needed.   04/08/2023   Allergies: No Known Allergies  Family History  Problem Relation Age of Onset   Diabetes Father    Diabetes Sister    Social History:  reports that he has never smoked. He has never used smokeless tobacco. He reports that he does not drink alcohol and does not use drugs.  ROS: A complete review of systems was performed.  All systems are negative except for pertinent findings as noted. Review of Systems  Musculoskeletal:  Positive for joint pain.  All other systems reviewed and are negative. He has right to thumb joints have been sore and swollen for more than a month.  He has an appointment for this to be evaluated coming up.   Physical Exam:  Vital signs in last 24 hours: Temp:  [97.5 F (36.4 C)] 97.5 F (36.4 C) (10/25 0643) Pulse Rate:  [66] 66 (10/25 0643) Resp:  [16] 16 (10/25 0643) BP: (156)/(80) 156/80 (10/25 0643) SpO2:  [97 %] 97 % (10/25 0643) Weight:  [85 kg] 85 kg (10/25 0643) General:  Alert and oriented, No acute distress HEENT: Normocephalic, atraumatic Cardiovascular: Regular rate and  rhythm Lungs: Regular rate and effort Abdomen: Soft, nontender, nondistended, no abdominal masses Back: No CVA tenderness Extremities: No edema Neurologic: Grossly intact  Laboratory Data:  No results found for this or any previous visit (from the past 24 hour(s)). No results found for this or any previous visit (from the past 240 hour(s)). Creatinine: No results for input(s): "CREATININE" in the last 168 hours.  Impression/Assessment:  High risk prostate cancer-  Plan:  I discussed with the patient the nature, potential benefits, risks and alternatives to gold seed prostate fiducial markers and SpaceOAR biodegradable gel insertion, including side effects of the proposed  treatment, the likelihood of the patient achieving the goals of the procedure, and any potential problems that might occur during the procedure or recuperation. All questions answered. Patient elects to proceed.   [name] 04/13/2023, 7:37 AM

## 2023-04-13 NOTE — Transfer of Care (Signed)
Immediate Anesthesia Transfer of Care Note  Patient: Jorge Rodriguez  Procedure(s) Performed: GOLD SEED IMPLANT (Prostate) SPACE OAR INSTILLATION (Prostate)  Patient Location: PACU  Anesthesia Type:MAC  Level of Consciousness: awake, alert , and oriented  Airway & Oxygen Therapy: Patient Spontanous Breathing  Post-op Assessment: Report given to RN and Post -op Vital signs reviewed and stable  Post vital signs: Reviewed and stable  Last Vitals:  Vitals Value Taken Time  BP 120/74 04/13/23 0907  Temp    Pulse 70 04/13/23 0908  Resp 14 04/13/23 0908  SpO2 94 % 04/13/23 0908  Vitals shown include unfiled device data.  Last Pain:  Vitals:   04/13/23 0643  TempSrc: Oral  PainSc: 3       Patients Stated Pain Goal: 6 (04/13/23 1610)  Complications: No notable events documented.

## 2023-04-13 NOTE — Anesthesia Preprocedure Evaluation (Signed)
Anesthesia Evaluation  Patient identified by MRN, date of birth, ID band Patient awake    Reviewed: Allergy & Precautions, NPO status , Patient's Chart, lab work & pertinent test results  Airway Mallampati: I  TM Distance: >3 FB     Dental no notable dental hx. (+) Caps, Dental Advisory Given   Pulmonary neg pulmonary ROS   Pulmonary exam normal breath sounds clear to auscultation       Cardiovascular negative cardio ROS Normal cardiovascular exam Rhythm:Regular Rate:Normal     Neuro/Psych negative neurological ROS  negative psych ROS   GI/Hepatic ,GERD  Medicated,,  Endo/Other  negative endocrine ROS    Renal/GU Renal diseaseHx/o renal calculi   Prostate Ca    Musculoskeletal  (+) Arthritis , Osteoarthritis,    Abdominal   Peds  Hematology negative hematology ROS (+)   Anesthesia Other Findings   Reproductive/Obstetrics                              Anesthesia Physical Anesthesia Plan  ASA: 2  Anesthesia Plan: MAC   Post-op Pain Management: Minimal or no pain anticipated   Induction: Intravenous  PONV Risk Score and Plan: 3 and Treatment may vary due to age or medical condition and Propofol infusion  Airway Management Planned: Natural Airway, Simple Face Mask and Nasal Cannula  Additional Equipment: None  Intra-op Plan:   Post-operative Plan:   Informed Consent: I have reviewed the patients History and Physical, chart, labs and discussed the procedure including the risks, benefits and alternatives for the proposed anesthesia with the patient or authorized representative who has indicated his/her understanding and acceptance.     Dental advisory given  Plan Discussed with: Anesthesiologist and CRNA  Anesthesia Plan Comments:          Anesthesia Quick Evaluation

## 2023-04-16 ENCOUNTER — Telehealth: Payer: Self-pay | Admitting: *Deleted

## 2023-04-16 NOTE — Telephone Encounter (Signed)
CALLED PATIENT TO REMIND OF SIM APPT. FOR 04-17-23- ARRIVAL TIME- 1:45 PM @ [facility], INFORMED PATIENT TO ARRIVE WITH A FULL BLADDER, SPOKE WITH PATIENT AND HE IS AWARE OF THIS APPT. AND THE INSTRUCTIONS

## 2023-04-17 ENCOUNTER — Ambulatory Visit
Admission: RE | Admit: 2023-04-17 | Discharge: 2023-04-17 | Disposition: A | Discharge status: Home / Self Care | Payer: PPO | Source: Ambulatory Visit | Attending: Radiation Oncology | Admitting: Radiation Oncology

## 2023-04-17 ENCOUNTER — Encounter (HOSPITAL_BASED_OUTPATIENT_CLINIC_OR_DEPARTMENT_OTHER): Payer: Self-pay | Admitting: Urology

## 2023-04-17 DIAGNOSIS — C61 Malignant neoplasm of prostate: Secondary | ICD-10-CM | POA: Insufficient documentation

## 2023-04-17 NOTE — Progress Notes (Signed)
Radiation Oncology         ([phone]) [phone] ________________________________  Name: Jorge Rodriguez MRN: [id number]  Date: 04/17/2023  DOB: Jun 24, 1950  SIMULATION AND TREATMENT PLANNING NOTE    ICD-10-CM   1. Malignant neoplasm of prostate (HCC)  C61       DIAGNOSIS:  72 y.o. gentleman with Stage T2b adenocarcinoma of the prostate with Gleason score of 4+4, and PSA of 8.1.   NARRATIVE:  The patient was brought to the CT Simulation planning suite.  Identity was confirmed.  All relevant records and images related to the planned course of therapy were reviewed.  The patient freely provided informed written consent to proceed with treatment after reviewing the details related to the planned course of therapy. The consent form was witnessed and verified by the simulation staff.  Then, the patient was set-up in a stable reproducible supine position for radiation therapy.  A vacuum lock pillow device was custom fabricated to position his legs in a reproducible immobilized position.  Then, I performed a urethrogram under sterile conditions to identify the prostatic apex.  CT images were obtained.  Surface markings were placed.  The CT images were loaded into the planning software.  Then the prostate target and avoidance structures including the rectum, bladder, bowel and hips were contoured.  Treatment planning then occurred.  The radiation prescription was entered and confirmed.  A total of one complex treatment devices was fabricated. I have requested : Intensity Modulated Radiotherapy (IMRT) is medically necessary for this case for the following reason:  Rectal sparing.Marland Kitchen  PLAN:   The prostate, seminal vesicles, and pelvic lymph nodes will initially be treated to 45 Gy in 25 fractions of 1.8 Gy followed by a boost to the prostate only, to 75 Gy with 15 additional fractions of 2.0 Gy   ________________________________  Artist Pais Kathrynn Running, M.D.

## 2023-04-18 DIAGNOSIS — C61 Malignant neoplasm of prostate: Secondary | ICD-10-CM | POA: Diagnosis not present

## 2023-04-30 ENCOUNTER — Ambulatory Visit: Payer: PPO

## 2023-05-01 ENCOUNTER — Ambulatory Visit
Admission: RE | Admit: 2023-05-01 | Discharge: 2023-05-01 | Disposition: A | Discharge status: Home / Self Care | Payer: PPO | Source: Ambulatory Visit | Attending: Radiation Oncology | Admitting: Radiation Oncology

## 2023-05-01 ENCOUNTER — Other Ambulatory Visit: Payer: Self-pay

## 2023-05-01 DIAGNOSIS — Z510 Encounter for antineoplastic radiation therapy: Secondary | ICD-10-CM | POA: Insufficient documentation

## 2023-05-01 DIAGNOSIS — C61 Malignant neoplasm of prostate: Secondary | ICD-10-CM | POA: Diagnosis present

## 2023-05-01 LAB — RAD ONC ARIA SESSION SUMMARY
Course Elapsed Days: 0
Plan Fractions Treated to Date: 1
Plan Prescribed Dose Per Fraction: 1.8 Gy
Plan Total Fractions Prescribed: 25
Plan Total Prescribed Dose: 45 Gy
Reference Point Dosage Given to Date: 1.8 Gy
Reference Point Session Dosage Given: 1.8 Gy
Session Number: 1

## 2023-05-02 ENCOUNTER — Other Ambulatory Visit: Payer: Self-pay

## 2023-05-02 ENCOUNTER — Ambulatory Visit
Admission: RE | Admit: 2023-05-02 | Discharge: 2023-05-02 | Disposition: A | Discharge status: Home / Self Care | Payer: PPO | Source: Ambulatory Visit | Attending: Radiation Oncology

## 2023-05-02 DIAGNOSIS — Z510 Encounter for antineoplastic radiation therapy: Secondary | ICD-10-CM | POA: Diagnosis not present

## 2023-05-02 LAB — RAD ONC ARIA SESSION SUMMARY
Course Elapsed Days: 1
Plan Fractions Treated to Date: 2
Plan Prescribed Dose Per Fraction: 1.8 Gy
Plan Total Fractions Prescribed: 25
Plan Total Prescribed Dose: 45 Gy
Reference Point Dosage Given to Date: 3.6 Gy
Reference Point Session Dosage Given: 0.1811 Gy
Session Number: 2

## 2023-05-03 ENCOUNTER — Ambulatory Visit
Admission: RE | Admit: 2023-05-03 | Discharge: 2023-05-03 | Disposition: A | Discharge status: Home / Self Care | Payer: PPO | Source: Ambulatory Visit | Attending: Radiation Oncology | Admitting: Radiation Oncology

## 2023-05-03 ENCOUNTER — Other Ambulatory Visit: Payer: Self-pay

## 2023-05-03 DIAGNOSIS — Z510 Encounter for antineoplastic radiation therapy: Secondary | ICD-10-CM | POA: Diagnosis not present

## 2023-05-03 LAB — RAD ONC ARIA SESSION SUMMARY
Course Elapsed Days: 2
Plan Fractions Treated to Date: 3
Plan Prescribed Dose Per Fraction: 1.8 Gy
Plan Total Fractions Prescribed: 25
Plan Total Prescribed Dose: 45 Gy
Reference Point Dosage Given to Date: 5.4 Gy
Reference Point Session Dosage Given: 1.8 Gy
Session Number: 3

## 2023-05-04 ENCOUNTER — Other Ambulatory Visit: Payer: Self-pay

## 2023-05-04 ENCOUNTER — Ambulatory Visit
Admission: RE | Admit: 2023-05-04 | Discharge: 2023-05-04 | Disposition: A | Discharge status: Home / Self Care | Payer: PPO | Source: Ambulatory Visit | Attending: Radiation Oncology

## 2023-05-04 DIAGNOSIS — Z510 Encounter for antineoplastic radiation therapy: Secondary | ICD-10-CM | POA: Diagnosis not present

## 2023-05-04 LAB — RAD ONC ARIA SESSION SUMMARY
Course Elapsed Days: 3
Plan Fractions Treated to Date: 4
Plan Prescribed Dose Per Fraction: 1.8 Gy
Plan Total Fractions Prescribed: 25
Plan Total Prescribed Dose: 45 Gy
Reference Point Dosage Given to Date: 7.2 Gy
Reference Point Session Dosage Given: 1.8 Gy
Session Number: 4

## 2023-05-07 ENCOUNTER — Ambulatory Visit
Admission: RE | Admit: 2023-05-07 | Discharge: 2023-05-07 | Disposition: A | Discharge status: Home / Self Care | Payer: PPO | Source: Ambulatory Visit | Attending: Radiation Oncology

## 2023-05-07 ENCOUNTER — Other Ambulatory Visit: Payer: Self-pay

## 2023-05-07 DIAGNOSIS — Z510 Encounter for antineoplastic radiation therapy: Secondary | ICD-10-CM | POA: Diagnosis not present

## 2023-05-07 LAB — RAD ONC ARIA SESSION SUMMARY
Course Elapsed Days: 6
Plan Fractions Treated to Date: 5
Plan Prescribed Dose Per Fraction: 1.8 Gy
Plan Total Fractions Prescribed: 25
Plan Total Prescribed Dose: 45 Gy
Reference Point Dosage Given to Date: 9 Gy
Reference Point Session Dosage Given: 1.8 Gy
Session Number: 5

## 2023-05-08 ENCOUNTER — Ambulatory Visit
Admission: RE | Admit: 2023-05-08 | Discharge: 2023-05-08 | Disposition: A | Discharge status: Home / Self Care | Payer: PPO | Source: Ambulatory Visit | Attending: Radiation Oncology

## 2023-05-08 ENCOUNTER — Other Ambulatory Visit: Payer: Self-pay

## 2023-05-08 DIAGNOSIS — Z510 Encounter for antineoplastic radiation therapy: Secondary | ICD-10-CM | POA: Diagnosis not present

## 2023-05-08 LAB — RAD ONC ARIA SESSION SUMMARY
Course Elapsed Days: 7
Plan Fractions Treated to Date: 6
Plan Prescribed Dose Per Fraction: 1.8 Gy
Plan Total Fractions Prescribed: 25
Plan Total Prescribed Dose: 45 Gy
Reference Point Dosage Given to Date: 10.8 Gy
Reference Point Session Dosage Given: 1.8 Gy
Session Number: 6

## 2023-05-09 ENCOUNTER — Other Ambulatory Visit: Payer: Self-pay

## 2023-05-09 ENCOUNTER — Ambulatory Visit
Admission: RE | Admit: 2023-05-09 | Discharge: 2023-05-09 | Disposition: A | Discharge status: Home / Self Care | Payer: PPO | Source: Ambulatory Visit | Attending: Radiation Oncology

## 2023-05-09 DIAGNOSIS — Z510 Encounter for antineoplastic radiation therapy: Secondary | ICD-10-CM | POA: Diagnosis not present

## 2023-05-09 LAB — RAD ONC ARIA SESSION SUMMARY
Course Elapsed Days: 8
Plan Fractions Treated to Date: 7
Plan Prescribed Dose Per Fraction: 1.8 Gy
Plan Total Fractions Prescribed: 25
Plan Total Prescribed Dose: 45 Gy
Reference Point Dosage Given to Date: 12.6 Gy
Reference Point Session Dosage Given: 1.8 Gy
Session Number: 7

## 2023-05-10 ENCOUNTER — Ambulatory Visit
Admission: RE | Admit: 2023-05-10 | Discharge: 2023-05-10 | Disposition: A | Discharge status: Home / Self Care | Payer: PPO | Source: Ambulatory Visit | Attending: Radiation Oncology | Admitting: Radiation Oncology

## 2023-05-10 ENCOUNTER — Other Ambulatory Visit: Payer: Self-pay

## 2023-05-10 DIAGNOSIS — Z510 Encounter for antineoplastic radiation therapy: Secondary | ICD-10-CM | POA: Diagnosis not present

## 2023-05-10 LAB — RAD ONC ARIA SESSION SUMMARY
Course Elapsed Days: 9
Plan Fractions Treated to Date: 8
Plan Prescribed Dose Per Fraction: 1.8 Gy
Plan Total Fractions Prescribed: 25
Plan Total Prescribed Dose: 45 Gy
Reference Point Dosage Given to Date: 14.4 Gy
Reference Point Session Dosage Given: 1.8 Gy
Session Number: 8

## 2023-05-11 ENCOUNTER — Ambulatory Visit
Admission: RE | Admit: 2023-05-11 | Discharge: 2023-05-11 | Disposition: A | Discharge status: Home / Self Care | Payer: PPO | Source: Ambulatory Visit | Attending: Radiation Oncology | Admitting: Radiation Oncology

## 2023-05-11 ENCOUNTER — Other Ambulatory Visit: Payer: Self-pay

## 2023-05-11 DIAGNOSIS — Z510 Encounter for antineoplastic radiation therapy: Secondary | ICD-10-CM | POA: Diagnosis not present

## 2023-05-11 LAB — RAD ONC ARIA SESSION SUMMARY
Course Elapsed Days: 10
Plan Fractions Treated to Date: 9
Plan Prescribed Dose Per Fraction: 1.8 Gy
Plan Total Fractions Prescribed: 25
Plan Total Prescribed Dose: 45 Gy
Reference Point Dosage Given to Date: 16.2 Gy
Reference Point Session Dosage Given: 1.8 Gy
Session Number: 9

## 2023-05-13 ENCOUNTER — Ambulatory Visit
Admission: RE | Admit: 2023-05-13 | Discharge: 2023-05-13 | Disposition: A | Discharge status: Home / Self Care | Payer: PPO | Source: Ambulatory Visit | Attending: Radiation Oncology | Admitting: Radiation Oncology

## 2023-05-13 ENCOUNTER — Other Ambulatory Visit: Payer: Self-pay

## 2023-05-13 DIAGNOSIS — Z510 Encounter for antineoplastic radiation therapy: Secondary | ICD-10-CM | POA: Diagnosis not present

## 2023-05-13 LAB — RAD ONC ARIA SESSION SUMMARY
Course Elapsed Days: 12
Plan Fractions Treated to Date: 10
Plan Prescribed Dose Per Fraction: 1.8 Gy
Plan Total Fractions Prescribed: 25
Plan Total Prescribed Dose: 45 Gy
Reference Point Dosage Given to Date: 18 Gy
Reference Point Session Dosage Given: 1.8 Gy
Session Number: 10

## 2023-05-14 ENCOUNTER — Ambulatory Visit
Admission: RE | Admit: 2023-05-14 | Discharge: 2023-05-14 | Disposition: A | Discharge status: Home / Self Care | Payer: PPO | Source: Ambulatory Visit | Attending: Radiation Oncology

## 2023-05-14 ENCOUNTER — Other Ambulatory Visit: Payer: Self-pay

## 2023-05-14 DIAGNOSIS — Z510 Encounter for antineoplastic radiation therapy: Secondary | ICD-10-CM | POA: Diagnosis not present

## 2023-05-14 LAB — RAD ONC ARIA SESSION SUMMARY
Course Elapsed Days: 13
Plan Fractions Treated to Date: 11
Plan Prescribed Dose Per Fraction: 1.8 Gy
Plan Total Fractions Prescribed: 25
Plan Total Prescribed Dose: 45 Gy
Reference Point Dosage Given to Date: 19.8 Gy
Reference Point Session Dosage Given: 1.8 Gy
Session Number: 11

## 2023-05-15 ENCOUNTER — Other Ambulatory Visit: Payer: Self-pay

## 2023-05-15 ENCOUNTER — Ambulatory Visit
Admission: RE | Admit: 2023-05-15 | Discharge: 2023-05-15 | Disposition: A | Discharge status: Home / Self Care | Payer: PPO | Source: Ambulatory Visit | Attending: Radiation Oncology | Admitting: Radiation Oncology

## 2023-05-15 DIAGNOSIS — Z510 Encounter for antineoplastic radiation therapy: Secondary | ICD-10-CM | POA: Diagnosis not present

## 2023-05-15 LAB — RAD ONC ARIA SESSION SUMMARY
Course Elapsed Days: 14
Plan Fractions Treated to Date: 12
Plan Prescribed Dose Per Fraction: 1.8 Gy
Plan Total Fractions Prescribed: 25
Plan Total Prescribed Dose: 45 Gy
Reference Point Dosage Given to Date: 21.6 Gy
Reference Point Session Dosage Given: 1.8 Gy
Session Number: 12

## 2023-05-16 ENCOUNTER — Ambulatory Visit
Admission: RE | Admit: 2023-05-16 | Discharge: 2023-05-16 | Disposition: A | Discharge status: Home / Self Care | Payer: PPO | Source: Ambulatory Visit | Attending: Radiation Oncology | Admitting: Radiation Oncology

## 2023-05-16 ENCOUNTER — Other Ambulatory Visit: Payer: Self-pay

## 2023-05-16 DIAGNOSIS — Z510 Encounter for antineoplastic radiation therapy: Secondary | ICD-10-CM | POA: Diagnosis not present

## 2023-05-16 LAB — RAD ONC ARIA SESSION SUMMARY
Course Elapsed Days: 15
Plan Fractions Treated to Date: 13
Plan Prescribed Dose Per Fraction: 1.8 Gy
Plan Total Fractions Prescribed: 25
Plan Total Prescribed Dose: 45 Gy
Reference Point Dosage Given to Date: 23.4 Gy
Reference Point Session Dosage Given: 1.8 Gy
Session Number: 13

## 2023-05-20 ENCOUNTER — Ambulatory Visit: Payer: PPO

## 2023-05-21 ENCOUNTER — Ambulatory Visit
Admission: RE | Admit: 2023-05-21 | Discharge: 2023-05-21 | Disposition: A | Discharge status: Home / Self Care | Payer: PPO | Source: Ambulatory Visit | Attending: Radiation Oncology | Admitting: Radiation Oncology

## 2023-05-21 ENCOUNTER — Other Ambulatory Visit: Payer: Self-pay

## 2023-05-21 DIAGNOSIS — C61 Malignant neoplasm of prostate: Secondary | ICD-10-CM | POA: Insufficient documentation

## 2023-05-21 DIAGNOSIS — Z510 Encounter for antineoplastic radiation therapy: Secondary | ICD-10-CM | POA: Insufficient documentation

## 2023-05-21 LAB — RAD ONC ARIA SESSION SUMMARY
Course Elapsed Days: 20
Plan Fractions Treated to Date: 14
Plan Prescribed Dose Per Fraction: 1.8 Gy
Plan Total Fractions Prescribed: 25
Plan Total Prescribed Dose: 45 Gy
Reference Point Dosage Given to Date: 25.2 Gy
Reference Point Session Dosage Given: 1.8 Gy
Session Number: 14

## 2023-05-22 ENCOUNTER — Ambulatory Visit
Admission: RE | Admit: 2023-05-22 | Discharge: 2023-05-22 | Disposition: A | Discharge status: Home / Self Care | Payer: PPO | Source: Ambulatory Visit | Attending: Radiation Oncology

## 2023-05-22 ENCOUNTER — Other Ambulatory Visit: Payer: Self-pay

## 2023-05-22 DIAGNOSIS — Z510 Encounter for antineoplastic radiation therapy: Secondary | ICD-10-CM | POA: Diagnosis not present

## 2023-05-22 LAB — RAD ONC ARIA SESSION SUMMARY
Course Elapsed Days: 21
Plan Fractions Treated to Date: 15
Plan Prescribed Dose Per Fraction: 1.8 Gy
Plan Total Fractions Prescribed: 25
Plan Total Prescribed Dose: 45 Gy
Reference Point Dosage Given to Date: 27 Gy
Reference Point Session Dosage Given: 1.8 Gy
Session Number: 15

## 2023-05-23 ENCOUNTER — Other Ambulatory Visit: Payer: Self-pay

## 2023-05-23 ENCOUNTER — Ambulatory Visit
Admission: RE | Admit: 2023-05-23 | Discharge: 2023-05-23 | Disposition: A | Discharge status: Home / Self Care | Payer: PPO | Source: Ambulatory Visit | Attending: Radiation Oncology | Admitting: Radiation Oncology

## 2023-05-23 DIAGNOSIS — Z510 Encounter for antineoplastic radiation therapy: Secondary | ICD-10-CM | POA: Diagnosis not present

## 2023-05-23 LAB — RAD ONC ARIA SESSION SUMMARY
Course Elapsed Days: 22
Plan Fractions Treated to Date: 16
Plan Prescribed Dose Per Fraction: 1.8 Gy
Plan Total Fractions Prescribed: 25
Plan Total Prescribed Dose: 45 Gy
Reference Point Dosage Given to Date: 28.8 Gy
Reference Point Session Dosage Given: 1.8 Gy
Session Number: 16

## 2023-05-24 ENCOUNTER — Ambulatory Visit
Admission: RE | Admit: 2023-05-24 | Discharge: 2023-05-24 | Disposition: A | Discharge status: Home / Self Care | Payer: PPO | Source: Ambulatory Visit | Attending: Radiation Oncology | Admitting: Radiation Oncology

## 2023-05-24 ENCOUNTER — Other Ambulatory Visit: Payer: Self-pay

## 2023-05-24 DIAGNOSIS — Z510 Encounter for antineoplastic radiation therapy: Secondary | ICD-10-CM | POA: Diagnosis not present

## 2023-05-24 LAB — RAD ONC ARIA SESSION SUMMARY
Course Elapsed Days: 23
Plan Fractions Treated to Date: 17
Plan Prescribed Dose Per Fraction: 1.8 Gy
Plan Total Fractions Prescribed: 25
Plan Total Prescribed Dose: 45 Gy
Reference Point Dosage Given to Date: 30.6 Gy
Reference Point Session Dosage Given: 1.8 Gy
Session Number: 17

## 2023-05-25 ENCOUNTER — Ambulatory Visit
Admission: RE | Admit: 2023-05-25 | Discharge: 2023-05-25 | Disposition: A | Discharge status: Home / Self Care | Payer: PPO | Source: Ambulatory Visit | Attending: Radiation Oncology

## 2023-05-25 ENCOUNTER — Other Ambulatory Visit: Payer: Self-pay | Admitting: Radiation Oncology

## 2023-05-25 ENCOUNTER — Other Ambulatory Visit: Payer: Self-pay

## 2023-05-25 DIAGNOSIS — Z510 Encounter for antineoplastic radiation therapy: Secondary | ICD-10-CM | POA: Diagnosis not present

## 2023-05-25 LAB — RAD ONC ARIA SESSION SUMMARY
Course Elapsed Days: 24
Plan Fractions Treated to Date: 18
Plan Prescribed Dose Per Fraction: 1.8 Gy
Plan Total Fractions Prescribed: 25
Plan Total Prescribed Dose: 45 Gy
Reference Point Dosage Given to Date: 32.4 Gy
Reference Point Session Dosage Given: 1.8 Gy
Session Number: 18

## 2023-05-25 MED ORDER — TAMSULOSIN HCL 0.4 MG PO CAPS: 0.4000 mg | ORAL_CAPSULE | Freq: Every day | ORAL | 5 refills | Status: AC

## 2023-05-28 ENCOUNTER — Other Ambulatory Visit: Payer: Self-pay

## 2023-05-28 ENCOUNTER — Ambulatory Visit
Admission: RE | Admit: 2023-05-28 | Discharge: 2023-05-28 | Disposition: A | Discharge status: Home / Self Care | Payer: PPO | Source: Ambulatory Visit | Attending: Radiation Oncology | Admitting: Radiation Oncology

## 2023-05-28 DIAGNOSIS — Z510 Encounter for antineoplastic radiation therapy: Secondary | ICD-10-CM | POA: Diagnosis not present

## 2023-05-28 LAB — RAD ONC ARIA SESSION SUMMARY
Course Elapsed Days: 27
Plan Fractions Treated to Date: 19
Plan Prescribed Dose Per Fraction: 1.8 Gy
Plan Total Fractions Prescribed: 25
Plan Total Prescribed Dose: 45 Gy
Reference Point Dosage Given to Date: 34.2 Gy
Reference Point Session Dosage Given: 1.8 Gy
Session Number: 19

## 2023-05-29 ENCOUNTER — Ambulatory Visit
Admission: RE | Admit: 2023-05-29 | Discharge: 2023-05-29 | Disposition: A | Discharge status: Home / Self Care | Payer: PPO | Source: Ambulatory Visit | Attending: Radiation Oncology

## 2023-05-29 ENCOUNTER — Other Ambulatory Visit: Payer: Self-pay

## 2023-05-29 DIAGNOSIS — Z510 Encounter for antineoplastic radiation therapy: Secondary | ICD-10-CM | POA: Diagnosis not present

## 2023-05-29 LAB — RAD ONC ARIA SESSION SUMMARY
Course Elapsed Days: 28
Plan Fractions Treated to Date: 20
Plan Prescribed Dose Per Fraction: 1.8 Gy
Plan Total Fractions Prescribed: 25
Plan Total Prescribed Dose: 45 Gy
Reference Point Dosage Given to Date: 36 Gy
Reference Point Session Dosage Given: 1.8 Gy
Session Number: 20

## 2023-05-30 ENCOUNTER — Ambulatory Visit
Admission: RE | Admit: 2023-05-30 | Discharge: 2023-05-30 | Disposition: A | Discharge status: Home / Self Care | Payer: PPO | Source: Ambulatory Visit | Attending: Radiation Oncology | Admitting: Radiation Oncology

## 2023-05-30 ENCOUNTER — Other Ambulatory Visit: Payer: Self-pay

## 2023-05-30 DIAGNOSIS — Z510 Encounter for antineoplastic radiation therapy: Secondary | ICD-10-CM | POA: Diagnosis not present

## 2023-05-30 LAB — RAD ONC ARIA SESSION SUMMARY
Course Elapsed Days: 29
Plan Fractions Treated to Date: 21
Plan Prescribed Dose Per Fraction: 1.8 Gy
Plan Total Fractions Prescribed: 25
Plan Total Prescribed Dose: 45 Gy
Reference Point Dosage Given to Date: 37.8 Gy
Reference Point Session Dosage Given: 1.8 Gy
Session Number: 21

## 2023-05-31 ENCOUNTER — Ambulatory Visit
Admission: RE | Admit: 2023-05-31 | Discharge: 2023-05-31 | Disposition: A | Discharge status: Home / Self Care | Payer: PPO | Source: Ambulatory Visit | Attending: Radiation Oncology | Admitting: Radiation Oncology

## 2023-05-31 ENCOUNTER — Other Ambulatory Visit: Payer: Self-pay

## 2023-05-31 DIAGNOSIS — Z510 Encounter for antineoplastic radiation therapy: Secondary | ICD-10-CM | POA: Diagnosis not present

## 2023-05-31 LAB — RAD ONC ARIA SESSION SUMMARY
Course Elapsed Days: 30
Plan Fractions Treated to Date: 22
Plan Prescribed Dose Per Fraction: 1.8 Gy
Plan Total Fractions Prescribed: 25
Plan Total Prescribed Dose: 45 Gy
Reference Point Dosage Given to Date: 39.6 Gy
Reference Point Session Dosage Given: 1.8 Gy
Session Number: 22

## 2023-06-01 ENCOUNTER — Other Ambulatory Visit: Payer: Self-pay

## 2023-06-01 ENCOUNTER — Ambulatory Visit
Admission: RE | Admit: 2023-06-01 | Discharge: 2023-06-01 | Disposition: A | Discharge status: Home / Self Care | Payer: PPO | Source: Ambulatory Visit | Attending: Radiation Oncology | Admitting: Radiation Oncology

## 2023-06-01 DIAGNOSIS — Z510 Encounter for antineoplastic radiation therapy: Secondary | ICD-10-CM | POA: Diagnosis not present

## 2023-06-01 LAB — RAD ONC ARIA SESSION SUMMARY
Course Elapsed Days: 31
Plan Fractions Treated to Date: 23
Plan Prescribed Dose Per Fraction: 1.8 Gy
Plan Total Fractions Prescribed: 25
Plan Total Prescribed Dose: 45 Gy
Reference Point Dosage Given to Date: 41.4 Gy
Reference Point Session Dosage Given: 1.8 Gy
Session Number: 23

## 2023-06-04 ENCOUNTER — Ambulatory Visit
Admission: RE | Admit: 2023-06-04 | Discharge: 2023-06-04 | Disposition: A | Discharge status: Home / Self Care | Payer: PPO | Source: Ambulatory Visit | Attending: Radiation Oncology | Admitting: Radiation Oncology

## 2023-06-04 ENCOUNTER — Other Ambulatory Visit: Payer: Self-pay

## 2023-06-04 DIAGNOSIS — Z510 Encounter for antineoplastic radiation therapy: Secondary | ICD-10-CM | POA: Diagnosis not present

## 2023-06-04 LAB — RAD ONC ARIA SESSION SUMMARY
Course Elapsed Days: 34
Plan Fractions Treated to Date: 24
Plan Prescribed Dose Per Fraction: 1.8 Gy
Plan Total Fractions Prescribed: 25
Plan Total Prescribed Dose: 45 Gy
Reference Point Dosage Given to Date: 43.2 Gy
Reference Point Session Dosage Given: 1.8 Gy
Session Number: 24

## 2023-06-05 ENCOUNTER — Other Ambulatory Visit: Payer: Self-pay

## 2023-06-05 ENCOUNTER — Ambulatory Visit
Admission: RE | Admit: 2023-06-05 | Discharge: 2023-06-05 | Disposition: A | Discharge status: Home / Self Care | Payer: PPO | Source: Ambulatory Visit | Attending: Radiation Oncology

## 2023-06-05 DIAGNOSIS — Z510 Encounter for antineoplastic radiation therapy: Secondary | ICD-10-CM | POA: Diagnosis not present

## 2023-06-05 LAB — RAD ONC ARIA SESSION SUMMARY
Course Elapsed Days: 35
Plan Fractions Treated to Date: 25
Plan Prescribed Dose Per Fraction: 1.8 Gy
Plan Total Fractions Prescribed: 25
Plan Total Prescribed Dose: 45 Gy
Reference Point Dosage Given to Date: 45 Gy
Reference Point Session Dosage Given: 1.8 Gy
Session Number: 25

## 2023-06-06 ENCOUNTER — Ambulatory Visit: Payer: PPO

## 2023-06-06 ENCOUNTER — Ambulatory Visit
Admission: RE | Admit: 2023-06-06 | Discharge: 2023-06-06 | Disposition: A | Discharge status: Home / Self Care | Payer: PPO | Source: Ambulatory Visit | Attending: Radiation Oncology | Admitting: Radiation Oncology

## 2023-06-06 ENCOUNTER — Other Ambulatory Visit: Payer: Self-pay

## 2023-06-06 DIAGNOSIS — Z510 Encounter for antineoplastic radiation therapy: Secondary | ICD-10-CM | POA: Diagnosis not present

## 2023-06-06 LAB — RAD ONC ARIA SESSION SUMMARY
Course Elapsed Days: 36
Plan Fractions Treated to Date: 1
Plan Prescribed Dose Per Fraction: 2 Gy
Plan Total Fractions Prescribed: 15
Plan Total Prescribed Dose: 30 Gy
Reference Point Dosage Given to Date: 2 Gy
Reference Point Session Dosage Given: 2 Gy
Session Number: 26

## 2023-06-07 ENCOUNTER — Ambulatory Visit
Admission: RE | Admit: 2023-06-07 | Discharge: 2023-06-07 | Disposition: A | Discharge status: Home / Self Care | Payer: PPO | Source: Ambulatory Visit | Attending: Radiation Oncology | Admitting: Radiation Oncology

## 2023-06-07 ENCOUNTER — Ambulatory Visit: Payer: PPO

## 2023-06-07 ENCOUNTER — Other Ambulatory Visit: Payer: Self-pay

## 2023-06-07 DIAGNOSIS — Z510 Encounter for antineoplastic radiation therapy: Secondary | ICD-10-CM | POA: Diagnosis not present

## 2023-06-07 LAB — RAD ONC ARIA SESSION SUMMARY
Course Elapsed Days: 37
Plan Fractions Treated to Date: 2
Plan Prescribed Dose Per Fraction: 2 Gy
Plan Total Fractions Prescribed: 15
Plan Total Prescribed Dose: 30 Gy
Reference Point Dosage Given to Date: 4 Gy
Reference Point Session Dosage Given: 2 Gy
Session Number: 27

## 2023-06-08 ENCOUNTER — Ambulatory Visit
Admission: RE | Admit: 2023-06-08 | Discharge: 2023-06-08 | Disposition: A | Discharge status: Home / Self Care | Payer: PPO | Source: Ambulatory Visit | Attending: Radiation Oncology | Admitting: Radiation Oncology

## 2023-06-08 ENCOUNTER — Other Ambulatory Visit: Payer: Self-pay

## 2023-06-08 DIAGNOSIS — Z510 Encounter for antineoplastic radiation therapy: Secondary | ICD-10-CM | POA: Diagnosis not present

## 2023-06-08 LAB — RAD ONC ARIA SESSION SUMMARY
Course Elapsed Days: 38
Plan Fractions Treated to Date: 3
Plan Prescribed Dose Per Fraction: 2 Gy
Plan Total Fractions Prescribed: 15
Plan Total Prescribed Dose: 30 Gy
Reference Point Dosage Given to Date: 6 Gy
Reference Point Session Dosage Given: 2 Gy
Session Number: 28

## 2023-06-10 ENCOUNTER — Ambulatory Visit: Payer: PPO

## 2023-06-11 ENCOUNTER — Ambulatory Visit
Admission: RE | Admit: 2023-06-11 | Discharge: 2023-06-11 | Disposition: A | Discharge status: Home / Self Care | Payer: PPO | Source: Ambulatory Visit | Attending: Radiation Oncology | Admitting: Radiation Oncology

## 2023-06-11 ENCOUNTER — Other Ambulatory Visit: Payer: Self-pay

## 2023-06-11 DIAGNOSIS — Z510 Encounter for antineoplastic radiation therapy: Secondary | ICD-10-CM | POA: Diagnosis not present

## 2023-06-11 LAB — RAD ONC ARIA SESSION SUMMARY
Course Elapsed Days: 41
Plan Fractions Treated to Date: 4
Plan Prescribed Dose Per Fraction: 2 Gy
Plan Total Fractions Prescribed: 15
Plan Total Prescribed Dose: 30 Gy
Reference Point Dosage Given to Date: 8 Gy
Reference Point Session Dosage Given: 2 Gy
Session Number: 29

## 2023-06-12 ENCOUNTER — Other Ambulatory Visit: Payer: Self-pay

## 2023-06-12 ENCOUNTER — Ambulatory Visit
Admission: RE | Admit: 2023-06-12 | Discharge: 2023-06-12 | Disposition: A | Discharge status: Home / Self Care | Payer: PPO | Source: Ambulatory Visit | Attending: Radiation Oncology

## 2023-06-12 DIAGNOSIS — Z510 Encounter for antineoplastic radiation therapy: Secondary | ICD-10-CM | POA: Diagnosis not present

## 2023-06-12 LAB — RAD ONC ARIA SESSION SUMMARY
Course Elapsed Days: 42
Plan Fractions Treated to Date: 5
Plan Prescribed Dose Per Fraction: 2 Gy
Plan Total Fractions Prescribed: 15
Plan Total Prescribed Dose: 30 Gy
Reference Point Dosage Given to Date: 10 Gy
Reference Point Session Dosage Given: 2 Gy
Session Number: 30

## 2023-06-14 ENCOUNTER — Other Ambulatory Visit: Payer: Self-pay

## 2023-06-14 ENCOUNTER — Ambulatory Visit
Admission: RE | Admit: 2023-06-14 | Discharge: 2023-06-14 | Disposition: A | Discharge status: Home / Self Care | Payer: PPO | Source: Ambulatory Visit | Attending: Radiation Oncology | Admitting: Radiation Oncology

## 2023-06-14 DIAGNOSIS — Z510 Encounter for antineoplastic radiation therapy: Secondary | ICD-10-CM | POA: Diagnosis not present

## 2023-06-14 LAB — RAD ONC ARIA SESSION SUMMARY
Course Elapsed Days: 44
Plan Fractions Treated to Date: 6
Plan Prescribed Dose Per Fraction: 2 Gy
Plan Total Fractions Prescribed: 15
Plan Total Prescribed Dose: 30 Gy
Reference Point Dosage Given to Date: 12 Gy
Reference Point Session Dosage Given: 2 Gy
Session Number: 31

## 2023-06-15 ENCOUNTER — Ambulatory Visit
Admission: RE | Admit: 2023-06-15 | Discharge: 2023-06-15 | Disposition: A | Discharge status: Home / Self Care | Payer: PPO | Source: Ambulatory Visit | Attending: Radiation Oncology | Admitting: Radiation Oncology

## 2023-06-15 ENCOUNTER — Other Ambulatory Visit: Payer: Self-pay

## 2023-06-15 DIAGNOSIS — Z510 Encounter for antineoplastic radiation therapy: Secondary | ICD-10-CM | POA: Diagnosis not present

## 2023-06-15 LAB — RAD ONC ARIA SESSION SUMMARY
Course Elapsed Days: 45
Plan Fractions Treated to Date: 7
Plan Prescribed Dose Per Fraction: 2 Gy
Plan Total Fractions Prescribed: 15
Plan Total Prescribed Dose: 30 Gy
Reference Point Dosage Given to Date: 14 Gy
Reference Point Session Dosage Given: 2 Gy
Session Number: 32

## 2023-06-18 ENCOUNTER — Other Ambulatory Visit: Payer: Self-pay

## 2023-06-18 ENCOUNTER — Ambulatory Visit
Admission: RE | Admit: 2023-06-18 | Discharge: 2023-06-18 | Disposition: A | Discharge status: Home / Self Care | Payer: PPO | Source: Ambulatory Visit | Attending: Radiation Oncology | Admitting: Radiation Oncology

## 2023-06-18 DIAGNOSIS — Z510 Encounter for antineoplastic radiation therapy: Secondary | ICD-10-CM | POA: Diagnosis not present

## 2023-06-18 LAB — RAD ONC ARIA SESSION SUMMARY
Course Elapsed Days: 48
Plan Fractions Treated to Date: 8
Plan Prescribed Dose Per Fraction: 2 Gy
Plan Total Fractions Prescribed: 15
Plan Total Prescribed Dose: 30 Gy
Reference Point Dosage Given to Date: 16 Gy
Reference Point Session Dosage Given: 2 Gy
Session Number: 33

## 2023-06-19 ENCOUNTER — Ambulatory Visit
Admission: RE | Admit: 2023-06-19 | Discharge: 2023-06-19 | Disposition: A | Discharge status: Home / Self Care | Payer: PPO | Source: Ambulatory Visit | Attending: Radiation Oncology

## 2023-06-19 ENCOUNTER — Other Ambulatory Visit: Payer: Self-pay

## 2023-06-19 DIAGNOSIS — Z510 Encounter for antineoplastic radiation therapy: Secondary | ICD-10-CM | POA: Diagnosis not present

## 2023-06-19 LAB — RAD ONC ARIA SESSION SUMMARY
Course Elapsed Days: 49
Plan Fractions Treated to Date: 9
Plan Prescribed Dose Per Fraction: 2 Gy
Plan Total Fractions Prescribed: 15
Plan Total Prescribed Dose: 30 Gy
Reference Point Dosage Given to Date: 18 Gy
Reference Point Session Dosage Given: 2 Gy
Session Number: 34

## 2023-06-21 ENCOUNTER — Ambulatory Visit
Admission: RE | Admit: 2023-06-21 | Discharge: 2023-06-21 | Disposition: A | Discharge status: Home / Self Care | Payer: PPO | Source: Ambulatory Visit | Attending: Radiation Oncology | Admitting: Radiation Oncology

## 2023-06-21 ENCOUNTER — Other Ambulatory Visit: Payer: Self-pay

## 2023-06-21 DIAGNOSIS — Z510 Encounter for antineoplastic radiation therapy: Secondary | ICD-10-CM | POA: Insufficient documentation

## 2023-06-21 DIAGNOSIS — C61 Malignant neoplasm of prostate: Secondary | ICD-10-CM | POA: Diagnosis present

## 2023-06-21 LAB — RAD ONC ARIA SESSION SUMMARY
Course Elapsed Days: 51
Plan Fractions Treated to Date: 10
Plan Prescribed Dose Per Fraction: 2 Gy
Plan Total Fractions Prescribed: 15
Plan Total Prescribed Dose: 30 Gy
Reference Point Dosage Given to Date: 20 Gy
Reference Point Session Dosage Given: 2 Gy
Session Number: 35

## 2023-06-22 ENCOUNTER — Other Ambulatory Visit: Payer: Self-pay

## 2023-06-22 ENCOUNTER — Ambulatory Visit
Admission: RE | Admit: 2023-06-22 | Discharge: 2023-06-22 | Disposition: A | Discharge status: Home / Self Care | Payer: PPO | Source: Ambulatory Visit | Attending: Radiation Oncology

## 2023-06-22 ENCOUNTER — Ambulatory Visit
Admission: RE | Admit: 2023-06-22 | Discharge: 2023-06-22 | Disposition: A | Discharge status: Home / Self Care | Payer: PPO | Source: Ambulatory Visit | Attending: Radiation Oncology | Admitting: Radiation Oncology

## 2023-06-22 DIAGNOSIS — Z510 Encounter for antineoplastic radiation therapy: Secondary | ICD-10-CM | POA: Diagnosis not present

## 2023-06-22 LAB — RAD ONC ARIA SESSION SUMMARY
Course Elapsed Days: 52
Plan Fractions Treated to Date: 11
Plan Prescribed Dose Per Fraction: 2 Gy
Plan Total Fractions Prescribed: 15
Plan Total Prescribed Dose: 30 Gy
Reference Point Dosage Given to Date: 22 Gy
Reference Point Session Dosage Given: 2 Gy
Session Number: 36

## 2023-06-25 ENCOUNTER — Ambulatory Visit
Admission: RE | Admit: 2023-06-25 | Discharge: 2023-06-25 | Disposition: A | Discharge status: Home / Self Care | Payer: PPO | Source: Ambulatory Visit | Attending: Radiation Oncology | Admitting: Radiation Oncology

## 2023-06-25 ENCOUNTER — Other Ambulatory Visit: Payer: Self-pay

## 2023-06-25 DIAGNOSIS — Z510 Encounter for antineoplastic radiation therapy: Secondary | ICD-10-CM | POA: Diagnosis not present

## 2023-06-25 LAB — RAD ONC ARIA SESSION SUMMARY
Course Elapsed Days: 55
Plan Fractions Treated to Date: 12
Plan Prescribed Dose Per Fraction: 2 Gy
Plan Total Fractions Prescribed: 15
Plan Total Prescribed Dose: 30 Gy
Reference Point Dosage Given to Date: 24 Gy
Reference Point Session Dosage Given: 2 Gy
Session Number: 37

## 2023-06-26 ENCOUNTER — Other Ambulatory Visit: Payer: Self-pay

## 2023-06-26 ENCOUNTER — Ambulatory Visit
Admission: RE | Admit: 2023-06-26 | Discharge: 2023-06-26 | Disposition: A | Discharge status: Home / Self Care | Payer: PPO | Source: Ambulatory Visit | Attending: Radiation Oncology

## 2023-06-26 DIAGNOSIS — Z510 Encounter for antineoplastic radiation therapy: Secondary | ICD-10-CM | POA: Diagnosis not present

## 2023-06-26 LAB — RAD ONC ARIA SESSION SUMMARY
Course Elapsed Days: 56
Plan Fractions Treated to Date: 13
Plan Prescribed Dose Per Fraction: 2 Gy
Plan Total Fractions Prescribed: 15
Plan Total Prescribed Dose: 30 Gy
Reference Point Dosage Given to Date: 26 Gy
Reference Point Session Dosage Given: 2 Gy
Session Number: 38

## 2023-06-27 ENCOUNTER — Ambulatory Visit
Admission: RE | Admit: 2023-06-27 | Discharge: 2023-06-27 | Disposition: A | Discharge status: Home / Self Care | Payer: PPO | Source: Ambulatory Visit | Attending: Radiation Oncology | Admitting: Radiation Oncology

## 2023-06-27 ENCOUNTER — Other Ambulatory Visit: Payer: Self-pay

## 2023-06-27 DIAGNOSIS — Z510 Encounter for antineoplastic radiation therapy: Secondary | ICD-10-CM | POA: Diagnosis not present

## 2023-06-27 LAB — RAD ONC ARIA SESSION SUMMARY
Course Elapsed Days: 57
Plan Fractions Treated to Date: 14
Plan Prescribed Dose Per Fraction: 2 Gy
Plan Total Fractions Prescribed: 15
Plan Total Prescribed Dose: 30 Gy
Reference Point Dosage Given to Date: 28 Gy
Reference Point Session Dosage Given: 2 Gy
Session Number: 39

## 2023-06-28 ENCOUNTER — Ambulatory Visit
Admission: RE | Admit: 2023-06-28 | Discharge: 2023-06-28 | Disposition: A | Discharge status: Home / Self Care | Payer: PPO | Source: Ambulatory Visit | Attending: Radiation Oncology | Admitting: Radiation Oncology

## 2023-06-28 ENCOUNTER — Ambulatory Visit: Payer: PPO

## 2023-06-28 ENCOUNTER — Other Ambulatory Visit: Payer: Self-pay

## 2023-06-28 DIAGNOSIS — Z510 Encounter for antineoplastic radiation therapy: Secondary | ICD-10-CM | POA: Diagnosis not present

## 2023-06-28 DIAGNOSIS — C61 Malignant neoplasm of prostate: Secondary | ICD-10-CM

## 2023-06-28 LAB — RAD ONC ARIA SESSION SUMMARY
Course Elapsed Days: 58
Plan Fractions Treated to Date: 15
Plan Prescribed Dose Per Fraction: 2 Gy
Plan Total Fractions Prescribed: 15
Plan Total Prescribed Dose: 30 Gy
Reference Point Dosage Given to Date: 30 Gy
Reference Point Session Dosage Given: 2 Gy
Session Number: 40

## 2023-06-29 ENCOUNTER — Ambulatory Visit: Payer: PPO

## 2023-06-29 NOTE — Progress Notes (Signed)
 Patient was a RadOnc Consult on 02/20/23 for his stage T2b adenocarcinoma of the prostate with Gleason score of 4+4, and PSA of 8.1.  Patient proceed with treatment recommendations of LT-ADT concurrent with 8 weeks of external beam therapy and had his final radiation treatment on 06/28/23.   Patient is scheduled for a post treatment nurse call on 07/31/23 and has his next Eligard injection with urology on 08/27/23.    RN spoke with patient and provided education on post treatment PSA monitoring.  All questions answered.  No additional needs at this time.

## 2023-06-29 NOTE — Radiation Completion Notes (Addendum)
  Radiation Oncology         [phone]) [phone] ________________________________  Name: Jorge Rodriguez MRN: [id number]  Date: 06/28/2023  DOB: 11/24/1950  Referring Physician: DONNICE BROOKS, M.D. Date of Service: 2023-06-29 Radiation Oncologist: Adina Barge, M.D. [facility]     RADIATION ONCOLOGY END OF TREATMENT NOTE     Diagnosis: 73 y.o. gentleman with Stage T2b adenocarcinoma of the prostate with Gleason score of 4+4, and PSA of 8.1.    Intent: Curative     ==========DELIVERED PLANS==========  First Treatment Date: 2023-05-01 Last Treatment Date: 2023-06-28   Plan Name: Prostate_Pelv Site: Prostate Technique: IMRT Mode: Photon Dose Per Fraction: 1.8 Gy Prescribed Dose (Delivered / Prescribed): 45 Gy / 45 Gy Prescribed Fxs (Delivered / Prescribed): 25 / 25   Plan Name: Prostate_Bst Site: Prostate Technique: IMRT Mode: Photon Dose Per Fraction: 2 Gy Prescribed Dose (Delivered / Prescribed): 30 Gy / 30 Gy Prescribed Fxs (Delivered / Prescribed): 15 / 15     ==========ON TREATMENT VISIT DATES========== 2023-05-03, 2023-05-11, 2023-05-21, 2023-05-25, 2023-05-30, 2023-06-08, 2023-06-15, 2023-06-22     See weekly On Treatment Notes in Epic for details in the Media tab (listed as Progress notes on the On Treatment Visit Dates listed above).  He tolerated the treatments well with only mild urinary symptoms and modest fatigue.  The patient will receive a call in about one month from the radiation oncology department. He will continue follow up with his urologist, Dr. Brooks, as well.  ------------------------------------------------   Donnice Barge, MD [facility]  Radiation Oncology Direct Dial: [phone]  Fax: [phone] [email or web address]  Skype  LinkedIn

## 2023-07-31 ENCOUNTER — Ambulatory Visit
Admission: RE | Admit: 2023-07-31 | Discharge: 2023-07-31 | Disposition: A | Discharge status: Home / Self Care | Payer: PPO | Source: Ambulatory Visit | Attending: Radiation Oncology | Admitting: Radiation Oncology

## 2023-07-31 NOTE — Progress Notes (Signed)
  Radiation Oncology         (336) [phone] ________________________________  Name: Jorge Rodriguez MRN: [id number]  Date of Service: 07/31/2023  DOB: 03/08/1951  Post Treatment Telephone Note  Diagnosis:  C61 Malignant neoplasm of prostate (as documented in provider EOT note)  Pre Treatment IPSS Score: 3 (as documented in the provider consult note)  The patient was available for call today.   Symptoms of fatigue have improved since completing therapy.  Symptoms of bladder changes have improved since completing therapy. Current symptoms include incomplete emptying, and medications for bladder symptoms include Tamsulosin.  Symptoms of bowel changes have improved since completing therapy. Current symptoms include none, and medications for bowel symptoms include none.   Post Treatment IPSS Score: IPSS Questionnaire (AUA-7): Over the past month.   1)  How often have you had a sensation of not emptying your bladder completely after you finish urinating?  3 - About half the time  2)  How often have you had to urinate again less than two hours after you finished urinating? 5 - Almost always  3)  How often have you found you stopped and started again several times when you urinated?  1 - Less than 1 time in 5  4) How difficult have you found it to postpone urination?  1 - Less than 1 time in 5  5) How often have you had a weak urinary stream?  0 - Not at all  6) How often have you had to push or strain to begin urination?  1 - Less than 1 time in 5  7) How many times did you most typically get up to urinate from the time you went to bed until the time you got up in the morning?  3 - 3 times  Total score:  14. Which indicates moderate symptoms  0-7 mildly symptomatic   8-19 moderately symptomatic   20-35 severely symptomatic    Patient has a scheduled follow up visit with his urologist, Dr. Mena Goes , on 08/2023 for ongoing surveillance. He was counseled that PSA levels will be drawn in the  urology office, and was reassured that additional time is expected to improve bowel and bladder symptoms. He was encouraged to call back with concerns or questions regarding radiation.  This concludes the interaction.  Ruel Favors, LPN

## 2023-08-10 ENCOUNTER — Other Ambulatory Visit: Payer: Self-pay | Admitting: Urology

## 2023-08-10 DIAGNOSIS — C61 Malignant neoplasm of prostate: Secondary | ICD-10-CM

## 2023-08-13 ENCOUNTER — Encounter: Payer: Self-pay | Admitting: *Deleted

## 2023-08-16 ENCOUNTER — Encounter: Payer: Self-pay | Admitting: *Deleted

## 2023-09-06 ENCOUNTER — Encounter: Payer: Self-pay | Admitting: *Deleted

## 2023-09-06 ENCOUNTER — Inpatient Hospital Stay: Payer: PPO | Attending: Adult Health | Admitting: *Deleted

## 2023-09-06 DIAGNOSIS — C61 Malignant neoplasm of prostate: Secondary | ICD-10-CM

## 2023-09-06 NOTE — Progress Notes (Signed)
 SCP reviewed and completed. Pt received last Eligard 08/27/23. Pt is on LT- hormone therapy. Last PSA labs was 0.072 since treatment. Pt says his energy is somewhat decreased but able to still play golf , walk and do things around the house. Most complaint was the hotflashes but he uses a personal fan that seems to help. Pt has never had a colonoscopy but has had 2 cologuards with negative results. Medications UTD. Diet and exercise was discussed with patient as well as handouts given to him. Also reviewed the resources we offer here at Cancer center.

## 2024-01-16 ENCOUNTER — Other Ambulatory Visit: Payer: Self-pay | Admitting: Radiation Oncology

## 2024-01-16 ENCOUNTER — Telehealth: Payer: Self-pay

## 2024-01-16 NOTE — Telephone Encounter (Signed)
 RN called patient to inform him that all refill requests will no longer come from Dr. Alline office since he is no longer in our care.  To ask his urologist for refill.

## 2024-01-16 NOTE — Telephone Encounter (Signed)
 Refills should come through his urologist, Dr. Donnice Brooks

## 2024-02-11 NOTE — Progress Notes (Signed)
 [facility] 02/11/24 No primary care provider on file. Treatment Providers Debby Mate, DDS  Dental procedures in this visit  . I0000 - CEMENT CROWN 20  . D0220 - [place] [facility]    HEALTH HISTORY ? Vitals:  BP Readings from Last 1 Encounters:  02/11/24 133/79    Pulse:  66 Medical history was reviewed and updated. No contraindication to care. Medical History[1] Surgical History[2] Social History   Tobacco Use  . Smoking status: Never  . Smokeless tobacco: Never  Substance Use Topics  . Alcohol use: Never   Family History[3] Medications Ordered Prior to Encounter[4] Medical Risk Assessment ASA GRADE: ASA 2 - Patient with mild systemic disease with no functional limitations  Subjective: Patient reports no change to condition since last visit.  Pt reports no complications.  Temporary crown is intact  Objective:  Radiographs taken: No radiographs captured at this appointment   Clinical findings: gingival health around temporary is healthy    Assessment: Paused to confirm correct patient, correct teeth for treatment, and confirmed diagnosis for treatment.  Pt present to clinic for Monolithic zirconia crown crown ##20 delivery #20 , with temp bond as a trial period and pt will return for perm seat after its' trial period as well as he wants to start the crown for tooth #28. Procedure risks, benefits, and concerns were explained to the patient.  A formal consent was signed after the risks were explained to the patient. none  Plan: Paused to confirm correct patient, correct teeth for treatment, and confirmed diagnosis for treatment.   Topical anesthetic (benzocaine 20%) was applied  ANESTHETIC 1: No Anesthetic required   Temp removed, clinical crown debrided and seat verified with BW. Occl checked and adjusted. Contacts checked and adjusted using fine diamond with water, polished with zirconia polishers.SABRA Hasting matched and approved by pt. Try-in consent  reviewed and signed yes Final restoration placed with Temp bond Final occlusion checked and adj. Restoration polished. Home care and post-op reviewed with pt.   Additional notes: None NV: Dr. appointment: to perm cement #20 if no problems and start on crown prep for #28 possibly.   Treatment Providers DA: [place] Debby Mate, DDS [facility] [address] [place] [id number] [phone]        [1] Past Medical History: Diagnosis Date  . Erectile dysfunction   . History of radiation therapy   [2] No past surgical history on file. [3] Family History Problem Relation Name Age of Onset  . No Known Problems Mother    . No Known Problems Father    [4] Current Outpatient Medications on File Prior to Visit  Medication Sig Dispense Refill  . hydroxypropyl methylcellulose (ISOPTO TEARS) 2.5 % ophthalmic solution Administer 1 drop into affected eye(s).    SABRA leuprolide (Eligard, 6 month,) 45 mg injection Inject 45 mg under the skin.    . melatonin-pyridoxine HCl, B6, 3-1 mg tablet Take 2 mg by mouth.    . naproxen sodium (ANAPROX) 220 mg tablet Take 440 mg by mouth.    SABRA omeprazole (PriLOSEC) 20 mg DR capsule Take 20 mg by mouth 1 (one) time each day if needed.    . tamsulosin  (FLOMAX ) 0.4 mg 24 hr capsule Take 0.4 mg by mouth.     No current facility-administered medications on file prior to visit.

## 2024-02-25 ENCOUNTER — Telehealth: Payer: Self-pay | Admitting: Radiation Oncology

## 2024-02-25 ENCOUNTER — Telehealth: Payer: Self-pay

## 2024-02-25 NOTE — Telephone Encounter (Signed)
 RN to return call to patient reports is applying for health insurance and wanted to know what his post radiation treatment Gleason score was.  RN informed patient  per nurse navigator Almarie Pont, RN that Gleason scores don't change post radiation treatment.  Reminded patient that urologist will be following up with PSA testing once radiation is completed.  Jorge Rodriguez completed radiation treatment for prostate on 06/28/2023.

## 2024-02-25 NOTE — Telephone Encounter (Signed)
 Pt called with questions about post-tx Gleason score that is requested by a new insurance company. Pt advised he called his urologist who stated they only do PSA updates and was advised to call oncologist. Call transferred to nurse [facility], pt was advised to leave a message for her to investigate and c/b once answer obtained by provider.
